# Patient Record
Sex: Male | Born: 1985 | Race: White | Hispanic: No | Marital: Single | Smoking: Never smoker
Health system: Southern US, Academic
[De-identification: ages and names within clinical notes are randomized; demographics above are authoritative.]

## PROBLEM LIST (undated history)

## (undated) DIAGNOSIS — K5900 Constipation, unspecified: Secondary | ICD-10-CM

## (undated) DIAGNOSIS — M109 Gout, unspecified: Secondary | ICD-10-CM

## (undated) DIAGNOSIS — D649 Anemia, unspecified: Secondary | ICD-10-CM

## (undated) DIAGNOSIS — I10 Essential (primary) hypertension: Secondary | ICD-10-CM

## (undated) DIAGNOSIS — F319 Bipolar disorder, unspecified: Secondary | ICD-10-CM

## (undated) DIAGNOSIS — R197 Diarrhea, unspecified: Secondary | ICD-10-CM

## (undated) DIAGNOSIS — F419 Anxiety disorder, unspecified: Secondary | ICD-10-CM

## (undated) DIAGNOSIS — K589 Irritable bowel syndrome without diarrhea: Secondary | ICD-10-CM

## (undated) DIAGNOSIS — R000 Tachycardia, unspecified: Secondary | ICD-10-CM

## (undated) DIAGNOSIS — K219 Gastro-esophageal reflux disease without esophagitis: Secondary | ICD-10-CM

## (undated) DIAGNOSIS — B1920 Unspecified viral hepatitis C without hepatic coma: Secondary | ICD-10-CM

## (undated) DIAGNOSIS — R002 Palpitations: Secondary | ICD-10-CM

## (undated) DIAGNOSIS — K760 Fatty (change of) liver, not elsewhere classified: Secondary | ICD-10-CM

## (undated) DIAGNOSIS — R7303 Prediabetes: Secondary | ICD-10-CM

## (undated) DIAGNOSIS — G4730 Sleep apnea, unspecified: Secondary | ICD-10-CM

## (undated) DIAGNOSIS — E785 Hyperlipidemia, unspecified: Secondary | ICD-10-CM

## (undated) DIAGNOSIS — N529 Male erectile dysfunction, unspecified: Secondary | ICD-10-CM

## (undated) DIAGNOSIS — E559 Vitamin D deficiency, unspecified: Secondary | ICD-10-CM

## (undated) DIAGNOSIS — R569 Unspecified convulsions: Secondary | ICD-10-CM

## (undated) DIAGNOSIS — K861 Other chronic pancreatitis: Secondary | ICD-10-CM

## (undated) HISTORY — DX: Gastro-esophageal reflux disease without esophagitis: K21.9

## (undated) HISTORY — DX: Anemia, unspecified: D64.9

## (undated) HISTORY — PX: ESOPHAGOGASTRODUODENOSCOPY: SHX1529

## (undated) HISTORY — DX: Other chronic pancreatitis: K86.1

## (undated) HISTORY — DX: Sleep apnea, unspecified: G47.30

## (undated) HISTORY — DX: Anxiety disorder, unspecified: F41.9

## (undated) HISTORY — DX: Vitamin D deficiency, unspecified: E55.9

## (undated) HISTORY — PX: HX HERNIA REPAIR: SHX51

## (undated) HISTORY — DX: Fatty (change of) liver, not elsewhere classified: K76.0

## (undated) HISTORY — DX: Tachycardia, unspecified: R00.0

## (undated) HISTORY — DX: Male erectile dysfunction, unspecified: N52.9

## (undated) HISTORY — PX: COLONOSCOPY: SHX174

## (undated) HISTORY — DX: Bipolar disorder, unspecified: F31.9

## (undated) HISTORY — DX: Essential (primary) hypertension: I10

## (undated) HISTORY — DX: Unspecified convulsions: R56.9

## (undated) HISTORY — DX: Unspecified viral hepatitis C without hepatic coma: B19.20

## (undated) NOTE — Progress Notes (Signed)
Formatting of this note is different from the original.  New Patient Visit    Patient Name:  Matthew Valenzuela, Male  Date of Birth:  [birth year], 33 y.o.  MEDICAL RECORD NUMBER [id number]  Date:    05/12/2022  Ref Provider:  No primary care provider on file.     History of Present Illness   Matthew Valenzuela is a 28 y.o. male. with PMH of HTN/ ETOH chronic pancreatitis/Sz on Keppra  here for first time for eval of his CKD 2  He denies any nausea, vomiting, diarrhea or weight/appetitie changes .   No urinary symptoms .and no recent hospitalizations or infections.  No NSAIDs intake.    The following portions of the patient's chart were reviewed in this encounter and updated as appropriate:        Current Outpatient Medications   Medication Sig Dispense Refill    amLODIPine (NORVASC) 2.5 MG tablet 2.5 mg  in the morning.      Cariprazine HCl (Vraylar) 3 MG capsule 1 capsule  in the morning.      famotidine (PEPCID) 20 MG tablet 20 mg      gabapentin (NEURONTIN) 300 MG capsule Take 300 mg by mouth in the morning and 300 mg in the evening.      levETIRAcetam XR (KEPPRA XR) 500 MG 24 hr tablet Take 1,000 mg by mouth every morning      omeprazole (PriLOSEC) 40 MG DR capsule 40 mg  in the morning.      propranolol LA (INDERAL LA) 60 MG 24 hr capsule TAKE 1 CAPSULE DAILY AT BEDTIME      QUEtiapine (SEROquel) 300 MG tablet 300 mg  in the morning.      atorvastatin (LIPITOR) 20 MG tablet Take 20 mg by mouth 1 (one) time each day      Ferrous Sulfate (IRON PO) Take by mouth       No current facility-administered medications for this visit.       Not on File    No past medical history on file.    No past surgical history on file.    No family history on file.           Physical Exam  Vitals:    05/12/22 1112   BP: 108/86   BP Location: Right upper arm   Patient Position: Sitting   Weight: 187 lb 12.8 oz (85.2 kg)     Gen: Awake, alert, and oriented. Well nourished  HEENT: Throat clear, Membranes moist. Pupils are equal, round and reactive to light.    Neck: Supple, no jugular venous distention  Heart: Regular Rate and Rhythm, S1, S2. No murmurs, No edema  Lungs: Clear to auscultation bilaterally with good respiratory effort  Abdomen: Soft, non-tender, non-distended, +BS  Extremities: moves all extremities well, all extremities are atraumatic  Skin: No visible rashes, Dry  Neuro: CN 3-12 are grossly intact, Normal speech  Psych: Alert and oriented to person, place, time, situation with a non-depressed mood    LABS:  Chemistry   Lab Units 02/25/22  0000 12/24/21  0000 10/17/21  0000 09/25/21  0000 05/09/21  0000   CREATININE mg/dL 1.23 1.36* 1.43* 1.36* 1.18   BUN mg/dL  --  '15 15 18 16   '$ POTASSIUM   --  4.0 4.4 4.4 4.4   SODIUM   --  138 140 139 141   CO2 mmol/L  --  '21 22 21 21   '$ CHLORIDE   --  101.0 99.0 99.0 99.0   ALBUMIN g/dL  --  4.3 4.6 4.9 4.8   EGFR  79  --   --   --   --    WBC AUTO 10*3/ML  --  4.7  --  8.5 6.4   HEMATOCRIT   --  46.5  --  48.9 50.4   HEMOGLOBIN   --  15.7  --  16.7 17.5   PLATELETS AUTO 10*3/UL  --  190  --  260 249     Bone Mineral   Lab Units 12/24/21  0000 10/17/21  0000 09/25/21  0000 05/09/21  0000   CALCIUM mg/dL 9.8 9.6 10.4 10.1   VIT D 25 HYDROXY ng/mL 26.6  --   --  36.0     Labs   Lab Units 05/09/21  0000   CHOLESTEROL TOTAL  214   HDL mg/dL 59   LDL CALC  127   TRIGLYCERIDES  160   CHOL/HDL RATIO  2.2         No results found for: "COLORU", "CLARITYU", "SPECGRAVUR", "PHUR", "GLUCOSEU", "KETONESU", "RBCU", "UROBILINOGEN", "LEUKOCYTESU", "NITRITEU"    Iron Studies   Lab Units 12/24/21  0000 09/25/21  0000 05/09/21  0000   IRON UG/DL 123 104 158   FERRITIN NG/ML 36.0 42.0 91.0   TIBC ug/dL 367 390 459   IRON SATURATION % 34 27 34     Available Imaging results reviewed.    Assessment & Plan   1. Chronic kidney disease, stage 2 (mild)    2. Hypertension      CKD stage 2  Cr is at 1.2-1.4 w eGFR at >70 ml/min in the past year  This is likely 2/2 HTN + increased muscle mass and possibly chronic dehydration fromhis chronic  diarrhea from chronic pancreatitis + drinking protein shake daily  Pt appears to be stable from volume and electrolytes stand point  Pt was educated about his stage 3 and methods to slow it down  Pt was advised to stay away from all NSAIDs ,stay hydrated and avoid IV contrast  BP goal should be below 140/90 mmHg    HTN   Controlled  On Norvasc    Sz disorder  On Keppra/gabapentin    ETOH pancreatitis  Chronic   On Inderal    No orders of the defined types were placed in this encounter.    Return in about 1 year (around 05/13/2023).      Fidela Salisbury, MD  Electronically signed by Fidela Salisbury, MD at 05/12/2022 11:25 AM EDT

---
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
## 2016-06-13 IMAGING — US ABD COMPLETE
1 series · 13 of 25 positions shown · non-contrast
Comparison: CT abdomen and pelvis dated 06/28/2008.

Exam:   
Complete abdominal sonogram, [facility]
INDICATION: Chronic pancreatitis and hepatitis C.

[Series 1: abd complete · oblique · 13 of 92 slices shown]
[im 1/92]
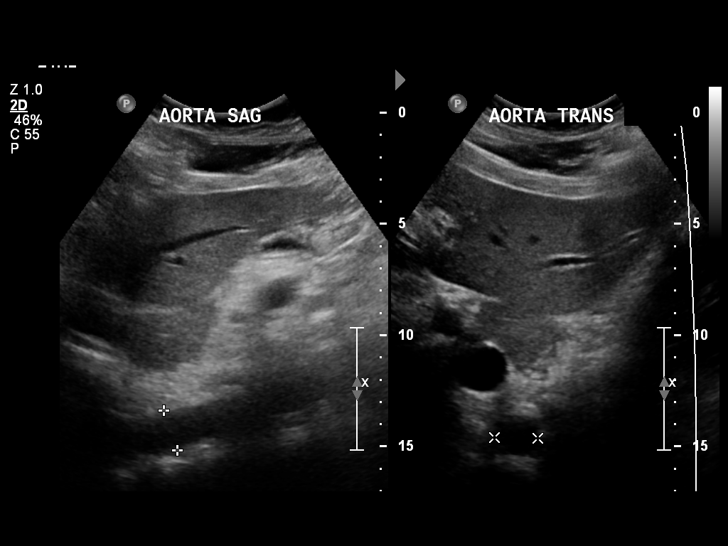
[im 8/92]
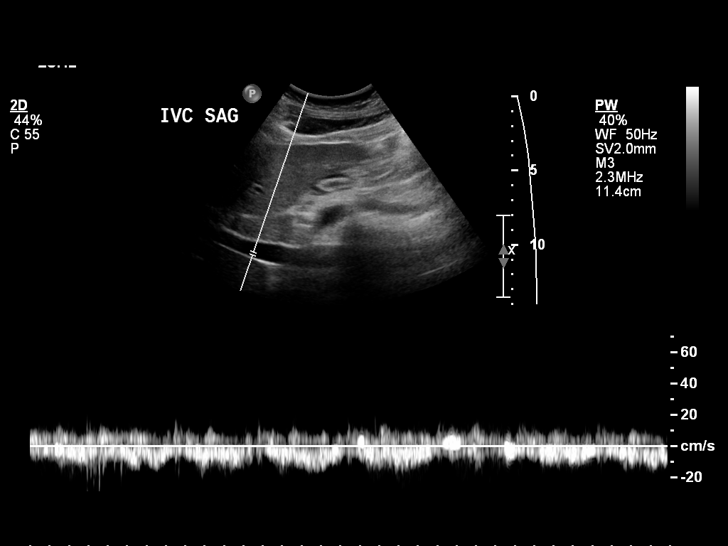
[im 16/92]
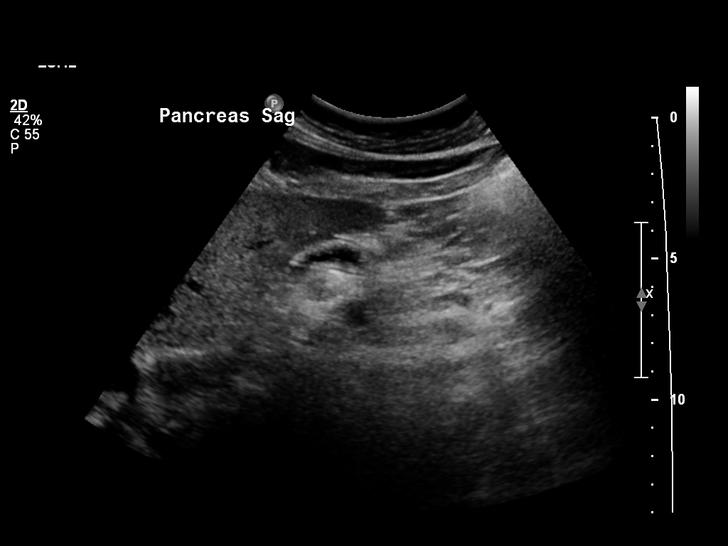
[im 23/92]
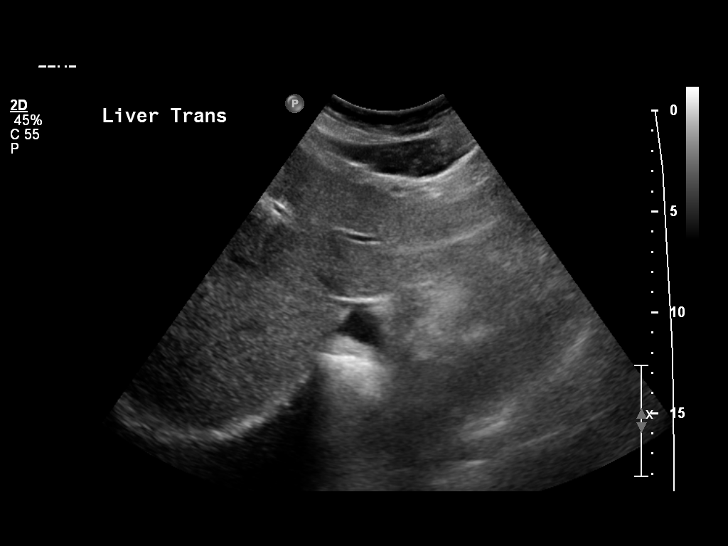
[im 31/92]
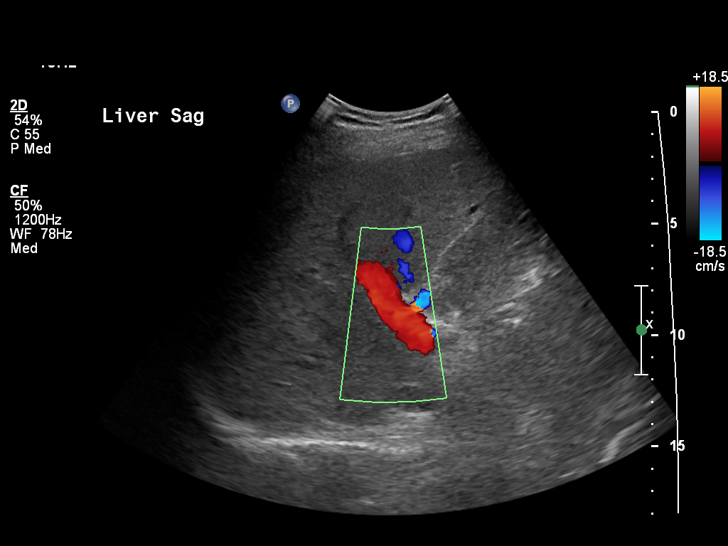
[im 38/92]
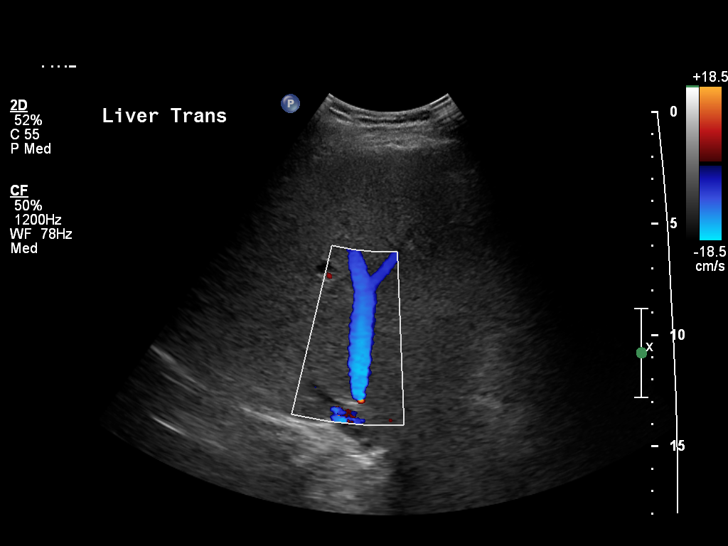
[im 46/92]
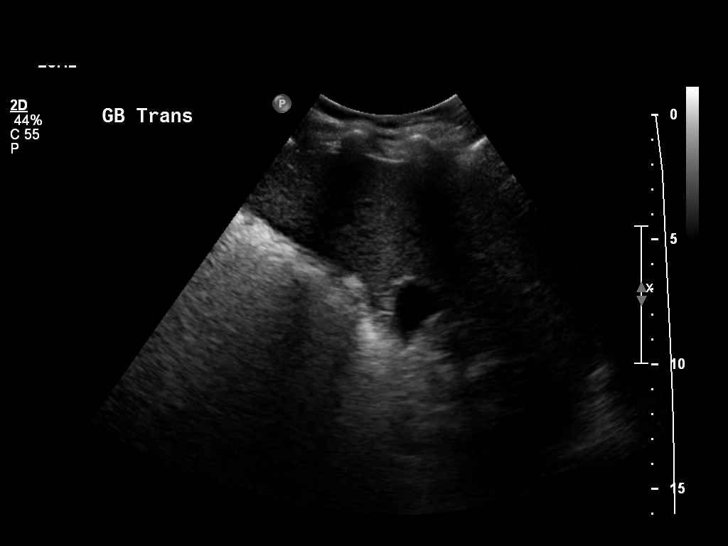
[im 54/92]
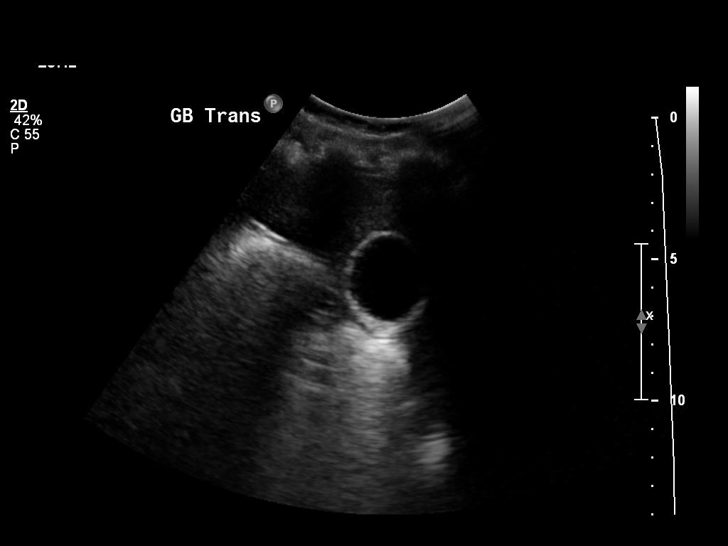
[im 61/92]
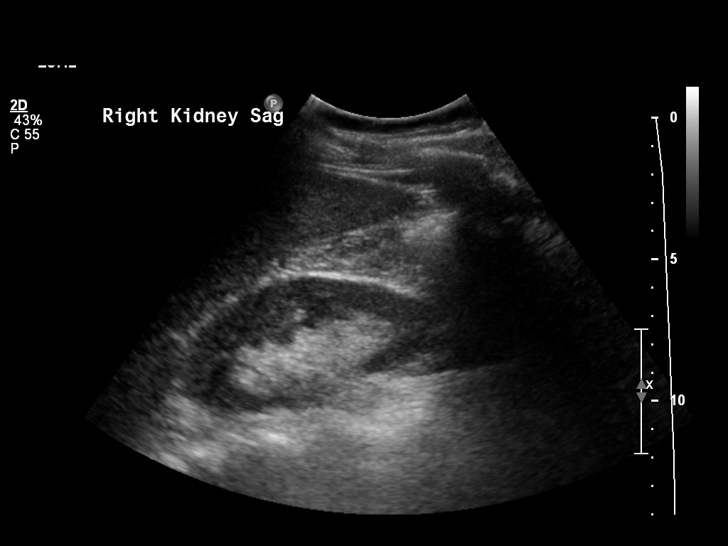
[im 69/92]
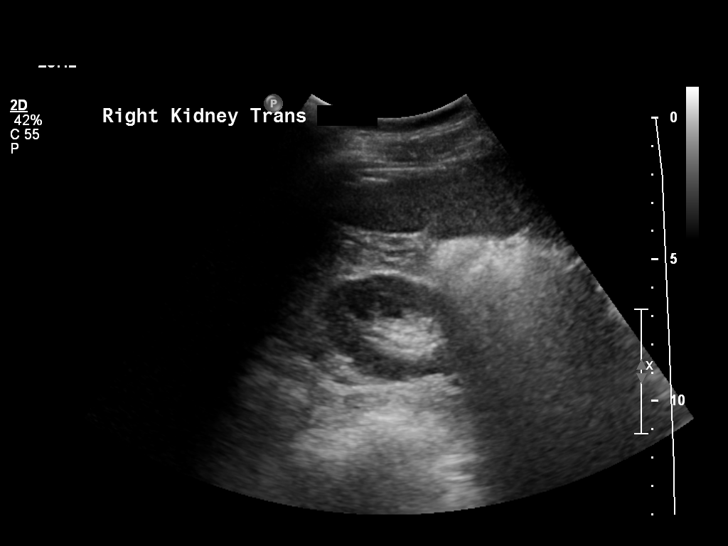
[im 76/92]
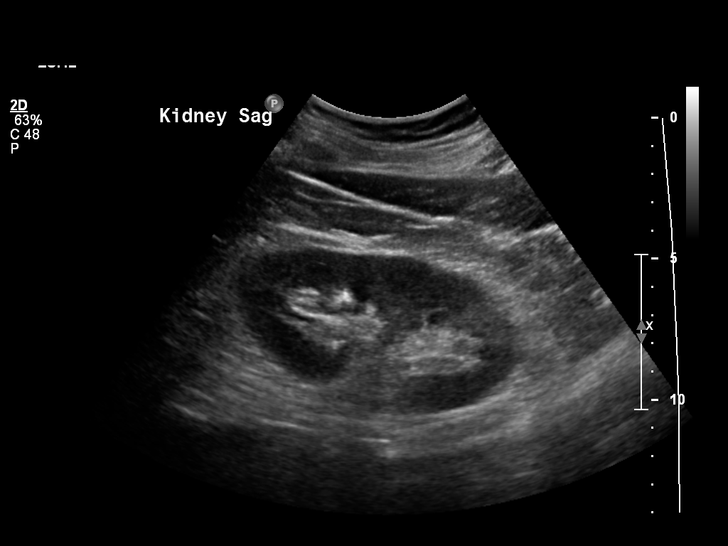
[im 84/92]
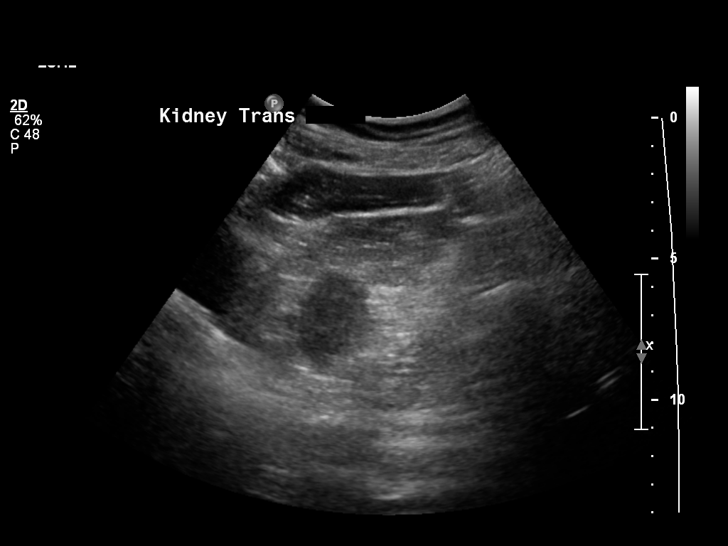
[im 92/92]
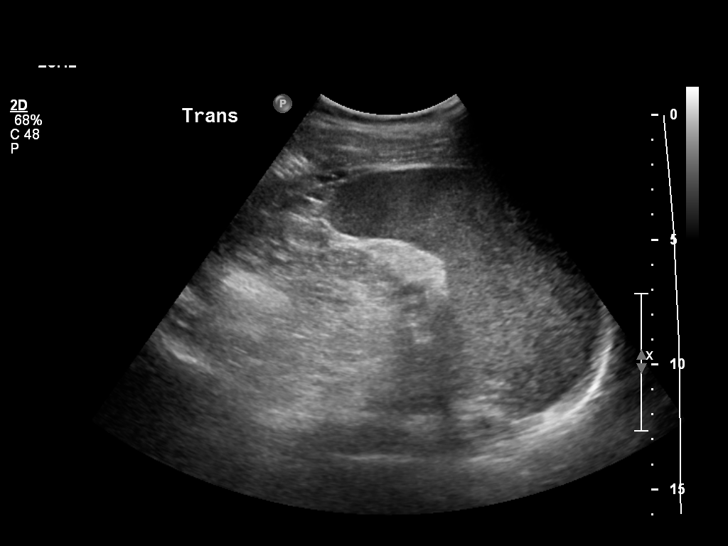

[13 of 25 positions shown; findings below may reference images not displayed]

FINDINGS: Liver is fatty and borderline enlarged measuring 18 cm in maximum sagittal dimension. Fatty infiltration limits evaluation for a focal hepatic mass. There is no intra or extra hepatic biliary ductal dilatation. Common bile duct measures 3 mm. No sludge or shadowing calculi are seen within the gallbladder. There is no gallbladder wall thickening or pericholecystic fluid. Pancreas is incompletely visualized due to artifact from overlying bowel gas. Spleen measures 10.5 cm and is normal. 
Kidneys are normal in echogenicity and measure 10 cm bilaterally. There is no hydronephrosis, mass or shadowing calculus on either side. 
Visualized abdominal aorta is without aneurysmal dilatation. IVC is normal. Portal vein measures 1 cm in diameter and demonstrates hepatopetal flow. Hepatic veins are also patent. There is no ascites.
IMPRESSION: 1.
Fatty and borderline enlarged liver. 
2.
No evidence of cholelithiasis or acute cholecystitis. 
3.
Pancreas incompletely visualized due to artifact from overlying bowel gas.

## 2017-11-23 IMAGING — US US ABDOMEN COMPLETE
1 series · 14 of 25 positions shown · non-contrast
Comparison: None.

EXAM:  US ABDOMEN COMPLETE
INDICATION: Chronic pancreatitis.

[Series 4: us abdomen complete · 0.35mm/px · 14 of 43 slices shown]
[im 1/43]
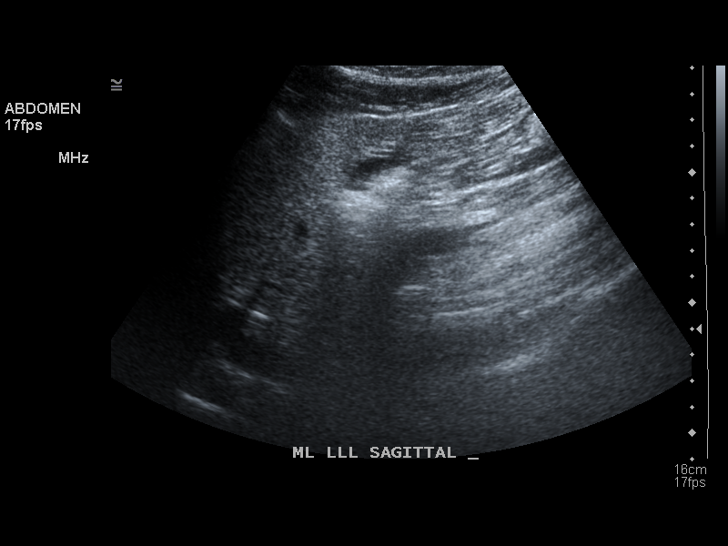
[im 4/43]
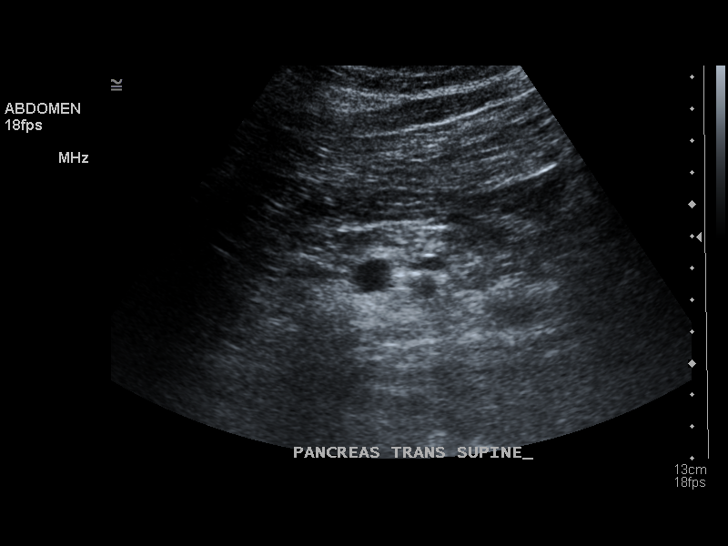
[im 8/43]
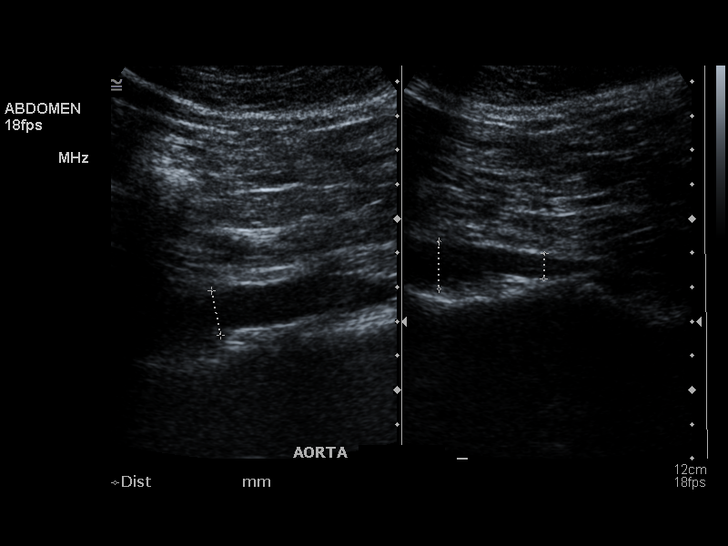
[im 11/43]
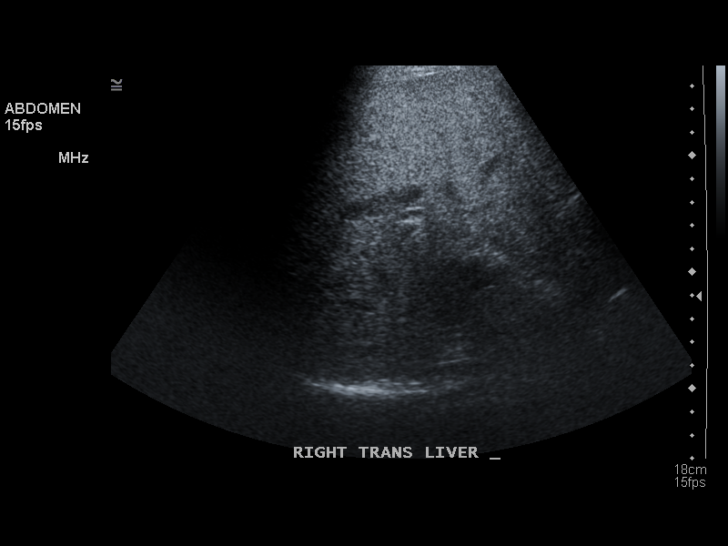
[im 15/43]
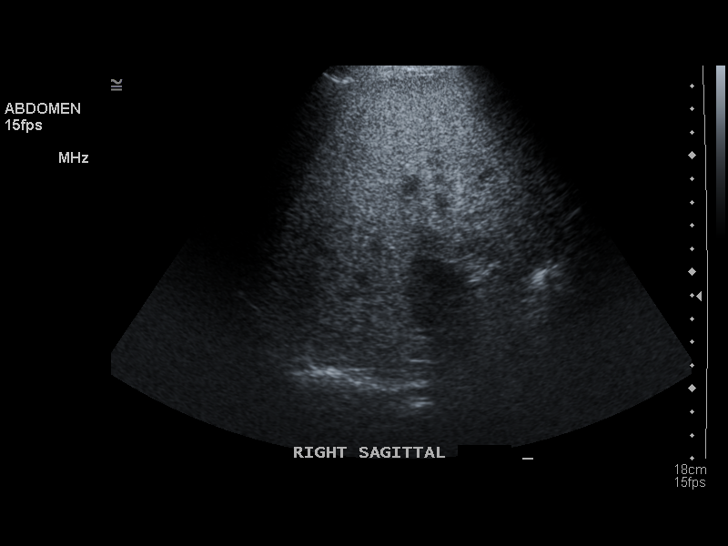
[im 16/43]
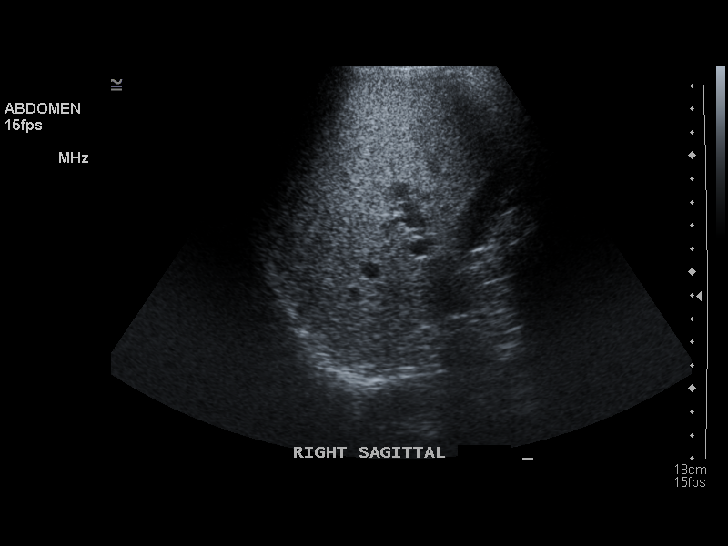
[im 20/43]
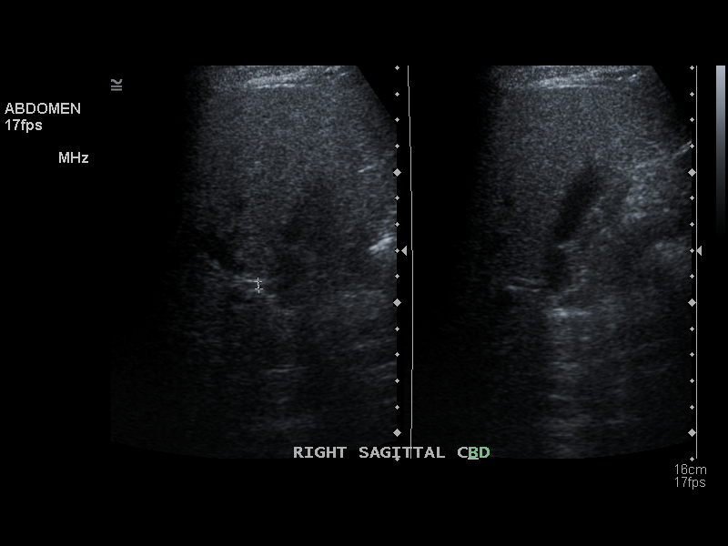
[im 23/43]
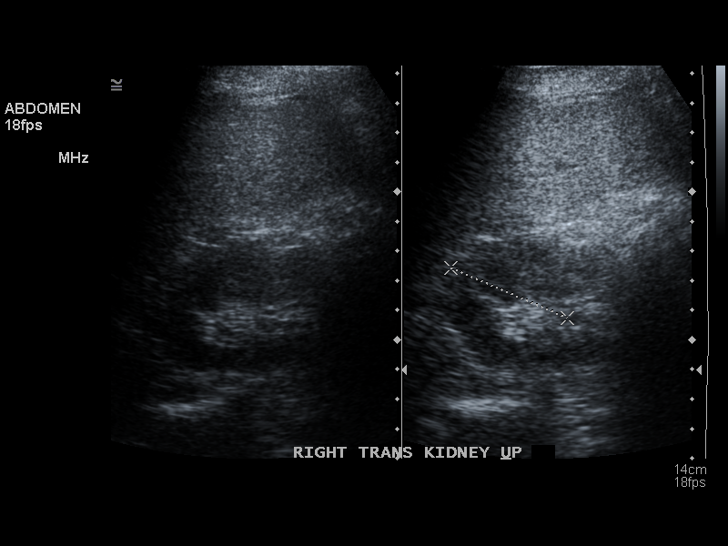
[im 27/43]
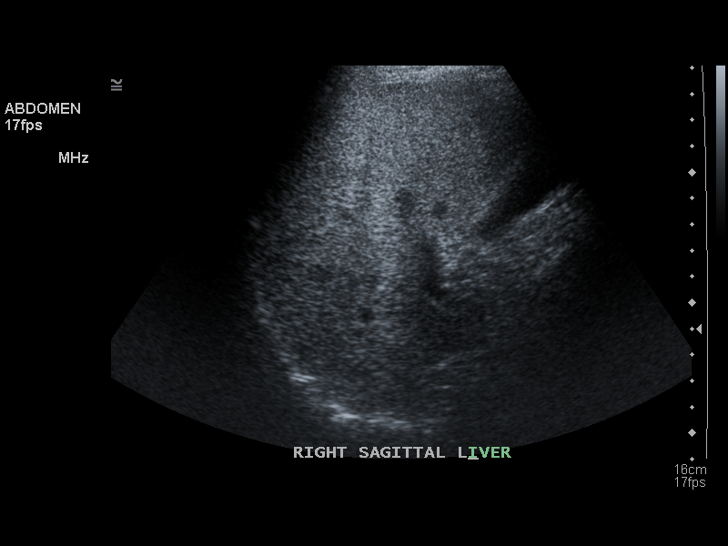
[im 29/43]
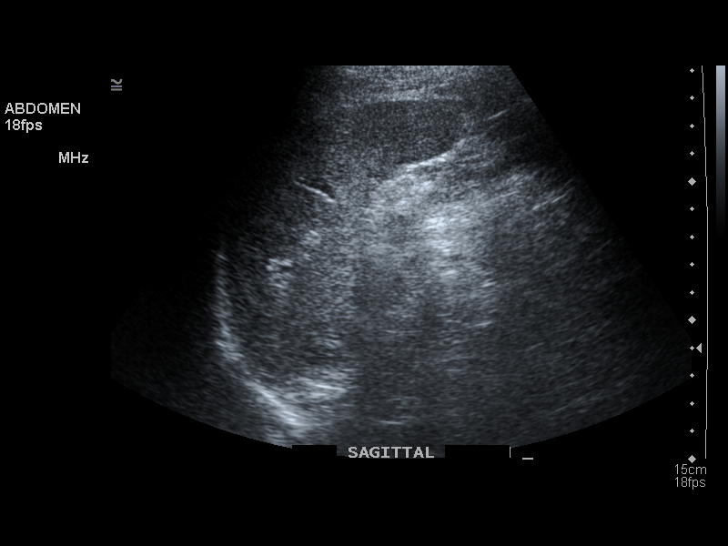
[im 32/43]
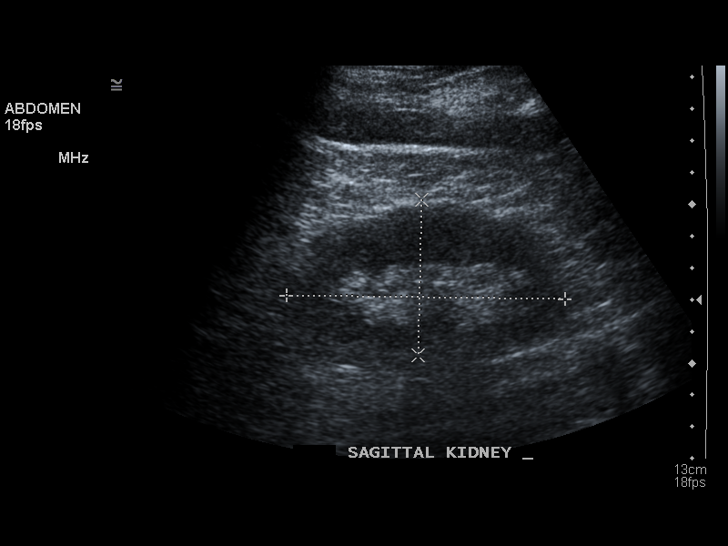
[im 36/43]
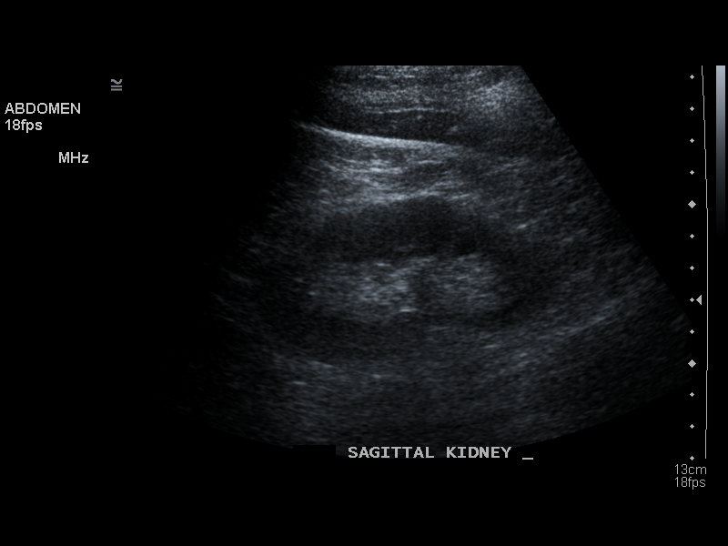
[im 39/43]
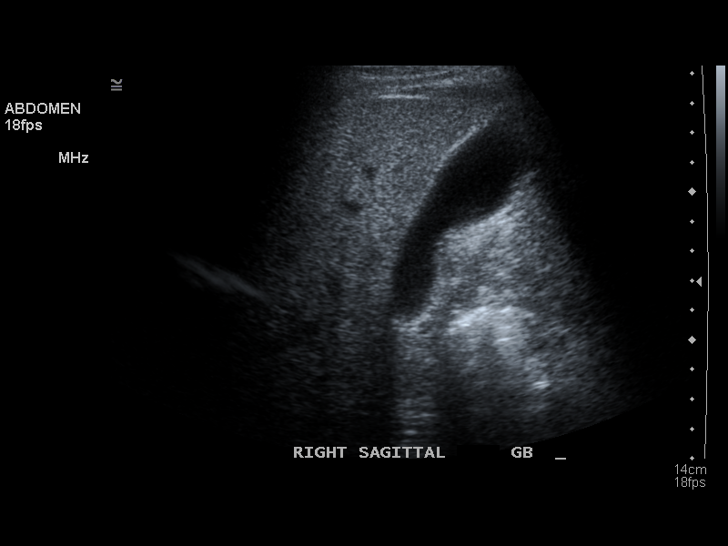
[im 43/43]
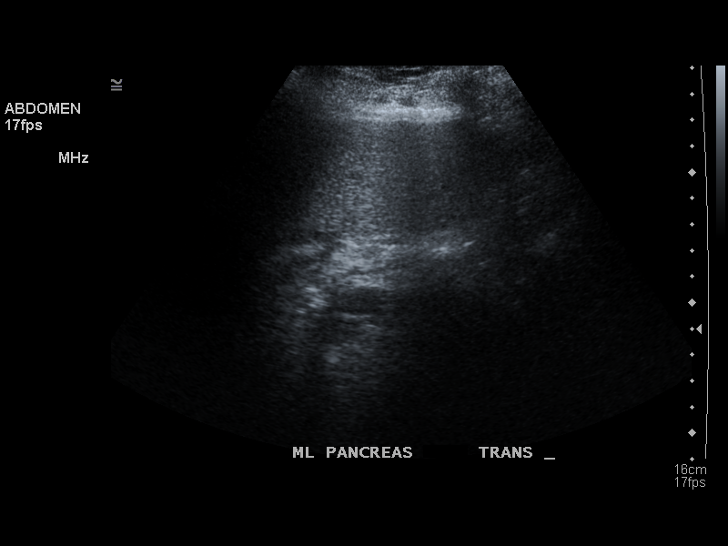

[14 of 25 positions shown; findings below may reference images not displayed]

FINDINGS: The liver measures 15.3 cm.  The portal vein is 15 mm in diameter and shows hepatopetal flow.  No echogenic filling defects are seen in the gallbladder.  The gallbladder wall measures 2 mm and the common duct is 3 mm in caliber.  Intestinal gas obscures the pancreas.  The spleen measures 12.7 cm and is enlarged.  The visualized portions of the aorta and inferior vena cava are normal in caliber.  The right kidney measures 8.8 cm while the left kidney measures 8.8 cm.  No hydronephrosis is observed.
IMPRESSION: Pancreas not visualized due to intestinal gas.

Normal gallbladder. 

No evidence for an abnormality in the hepatic parenchyma. 

No evidence for hydronephrosis in the kidneys.

## 2020-09-04 ENCOUNTER — Ambulatory Visit (INDEPENDENT_AMBULATORY_CARE_PROVIDER_SITE_OTHER): Payer: Self-pay | Admitting: Anesthesiology

## 2020-09-04 NOTE — Telephone Encounter (Signed)
I called and left message with pt in regards to appt date time. [email or web address] 09-04-20

## 2020-09-17 ENCOUNTER — Ambulatory Visit (INDEPENDENT_AMBULATORY_CARE_PROVIDER_SITE_OTHER): Payer: Self-pay | Admitting: Anesthesiology

## 2020-09-17 NOTE — Telephone Encounter (Signed)
I called and left message for pt in regards to appointment today. I let him know that I needed to push his appointment back to 1:00 due to the fact that Dr.Bhatia had an emergency case this morning and can not be here until 11:00 am or so. [email or web address] 09-17-20

## 2020-10-29 IMAGING — US ABD LIMITED
1 series · 14 of 25 positions shown · non-contrast
Comparison: 11/23/2017.

EXAM:  [facility] ABD U/S LMTD
INDICATION: K70.41.

[Series 1: abd limited · 14 of 60 slices shown]
[im 1/60]
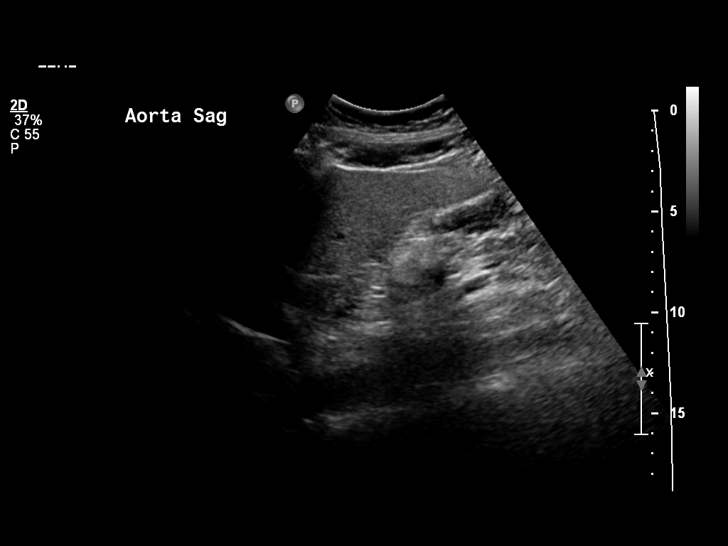
[im 5/60]
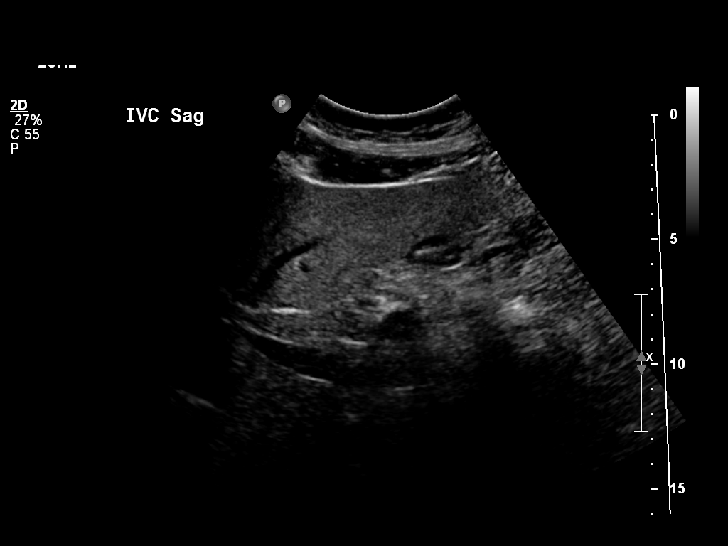
[im 10/60]
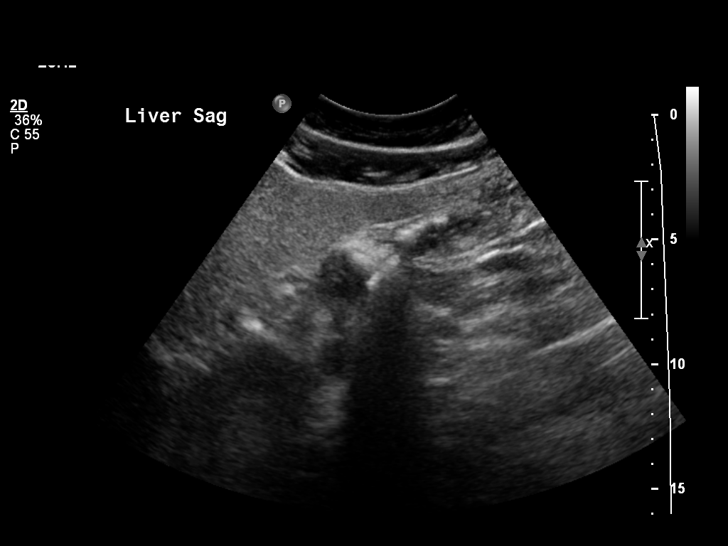
[im 15/60]
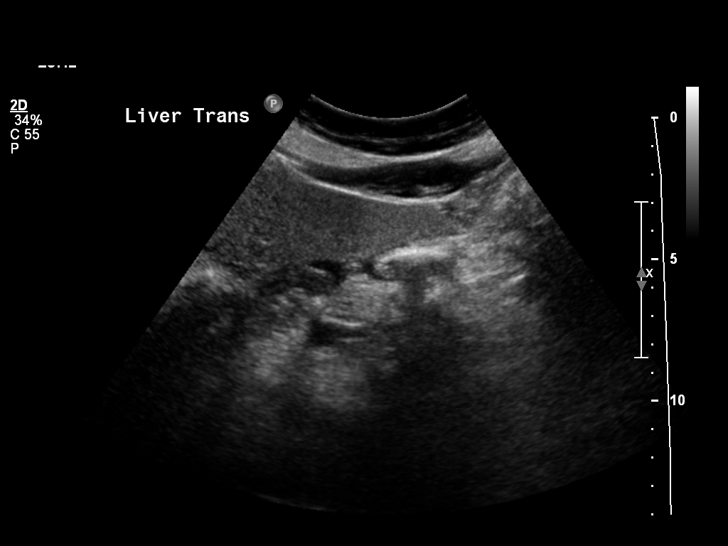
[im 20/60]
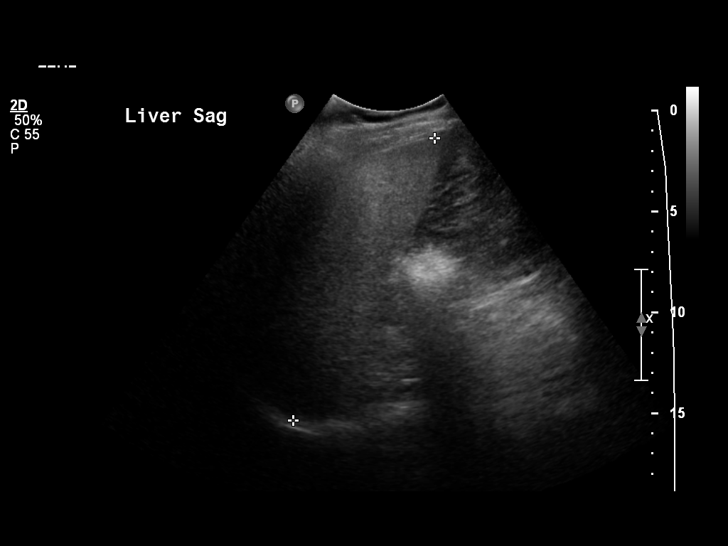
[im 23/60]
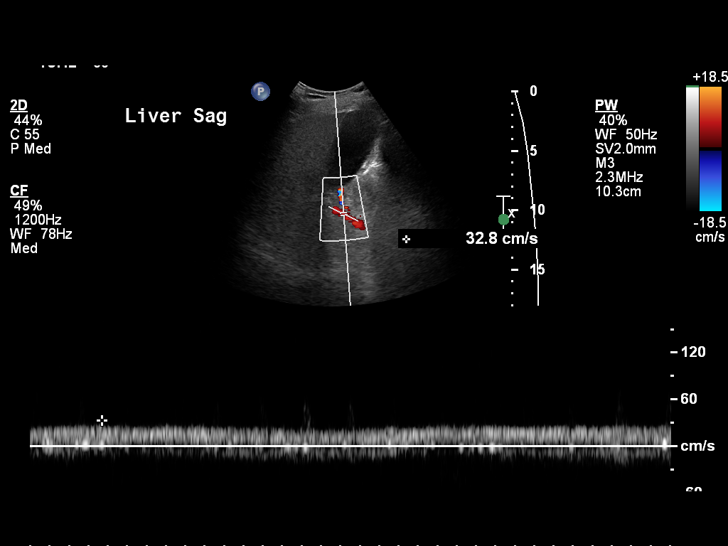
[im 28/60]
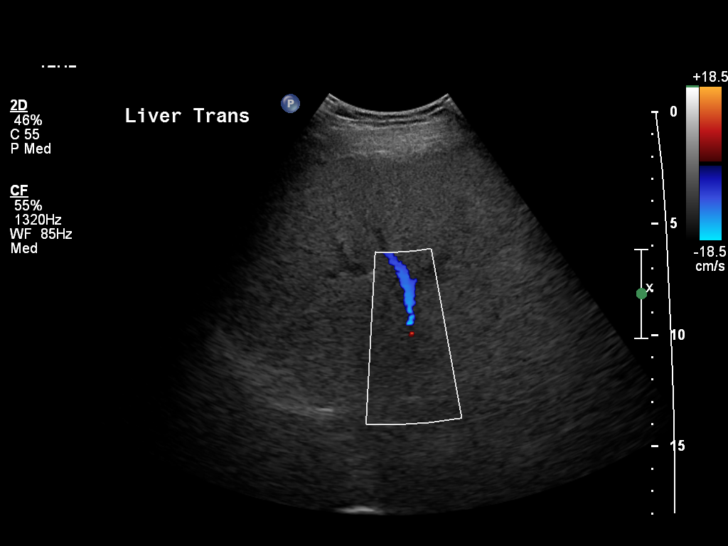
[im 32/60]
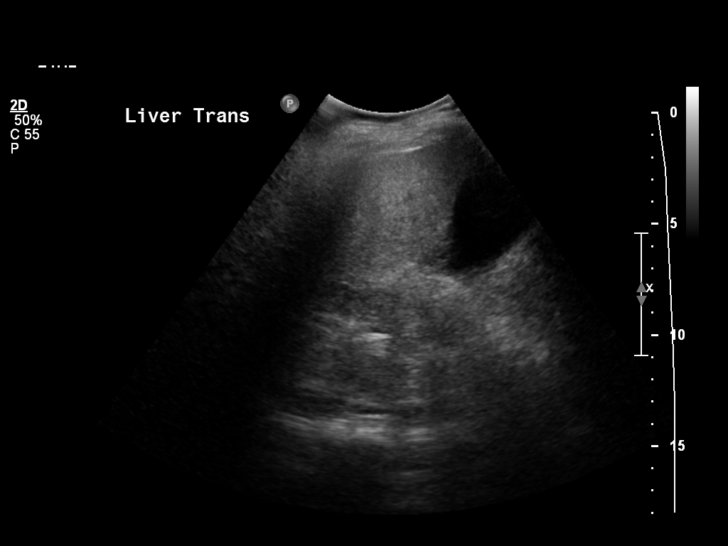
[im 37/60]
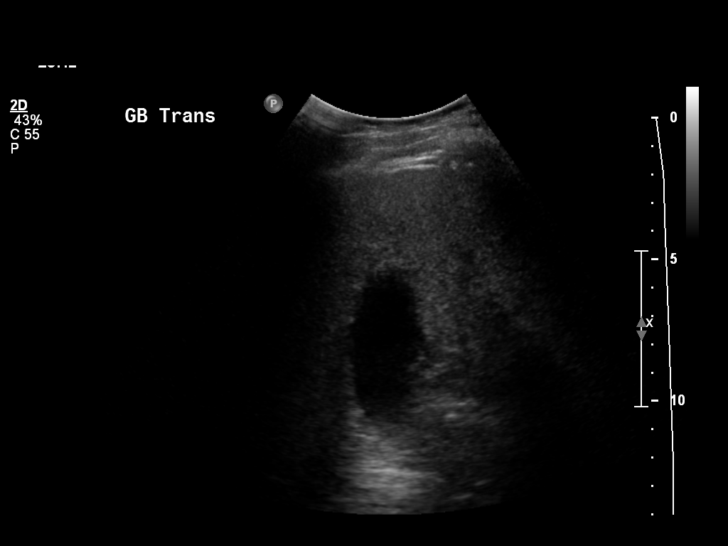
[im 40/60]
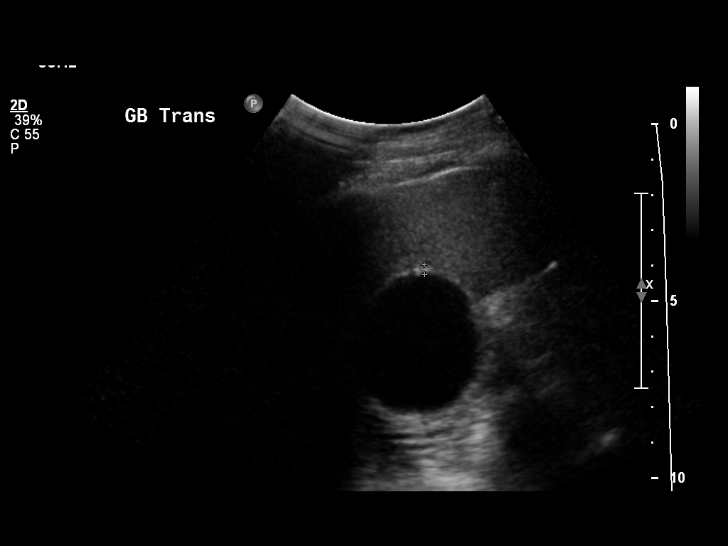
[im 45/60]
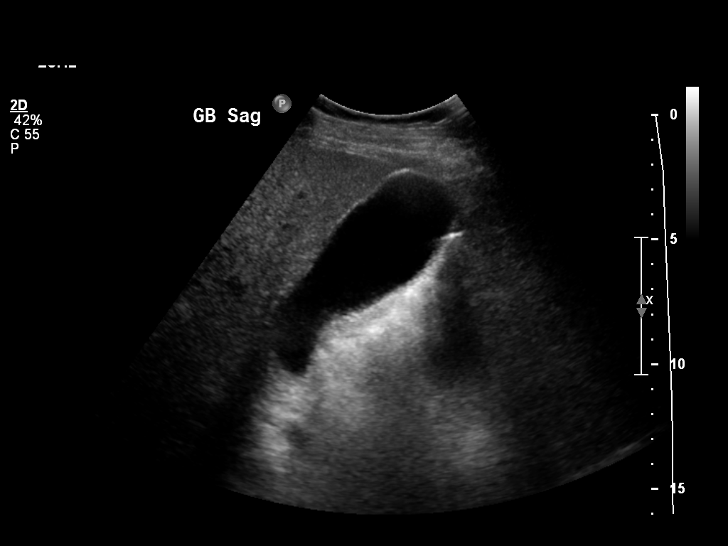
[im 50/60]
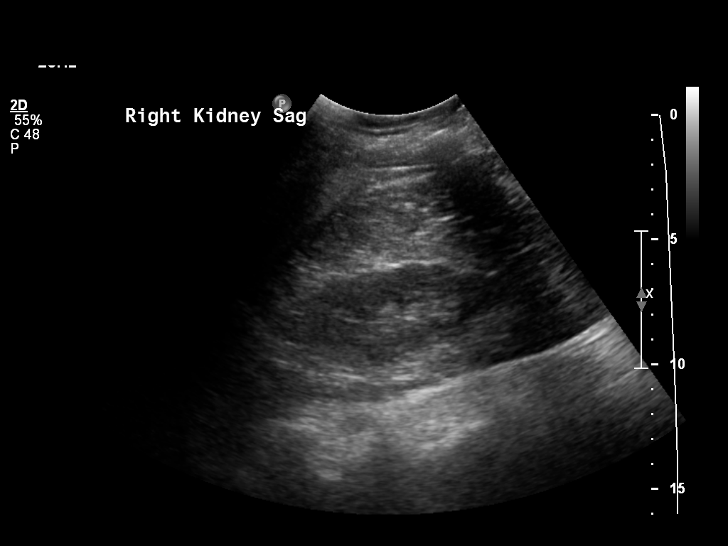
[im 55/60]
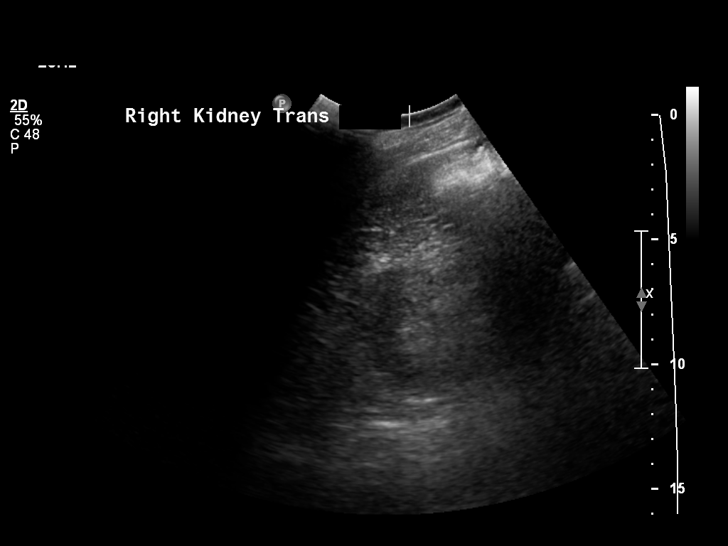
[im 60/60]
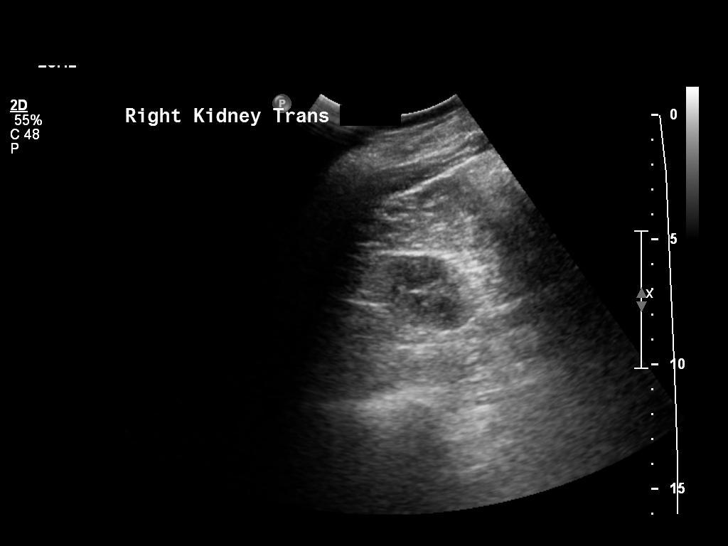

[14 of 25 positions shown; findings below may reference images not displayed]

FINDINGS: Liver is echogenic compatible with fatty infiltration. Fatty infiltration limits evaluation for focal hepatic mass. There is no intra or extrahepatic biliary ductal dilatation. Common bile duct measures 3 mm. No sludge or shadowing gallstones are seen. There is no gallbladder wall thickening or pericholecystic fluid. Pancreas is incompletely visualized due to artifact from overlying bowel gas. Right kidney measures 9.5 cm and is normal.

Visualized abdominal aorta is without aneurysmal dilatation. IVC is normal. Portal vein measures 7 mm in diameter and demonstrates hepatopetal flow. Hepatic veins are also patent. There is no ascites.
IMPRESSION: 1. Fatty liver. 

2. No evidence of cholelithiasis or acute cholecystitis. 

3. Pancreas incompletely visualized due to artifact from overlying bowel gas.

## 2022-01-06 IMAGING — US US ABDOMEN COMPLETE
1 series · 14 of 25 positions shown · non-contrast
Comparison: Sonogram dated 07/06/2019.

﻿EXAM: US ABDOMEN COMPLETE
INDICATION: Abdominal pain, suspected hernia.

[Series 1: us abdomen complete · 14 of 81 slices shown]
[im 1/81]
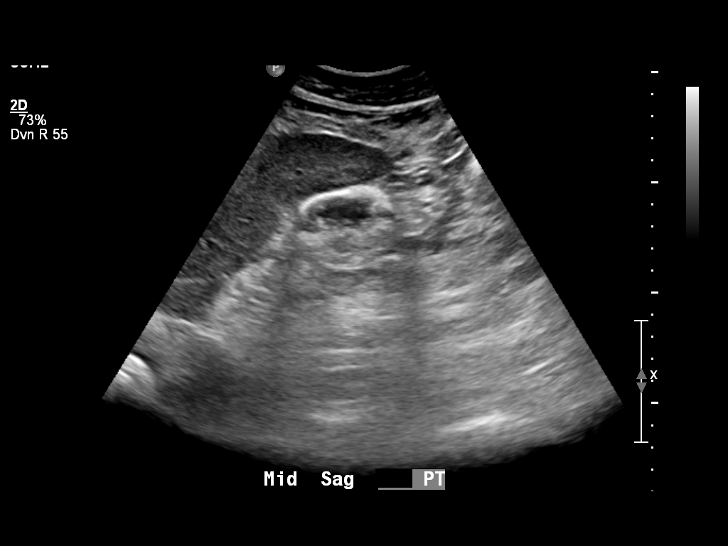
[im 7/81]
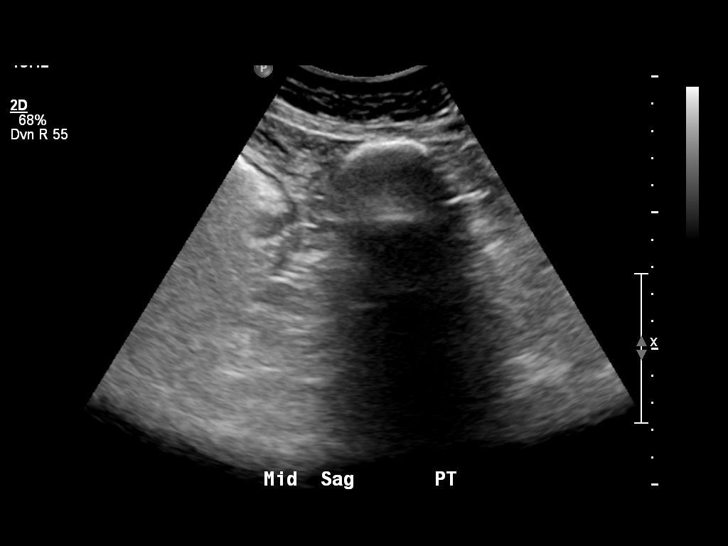
[im 14/81]
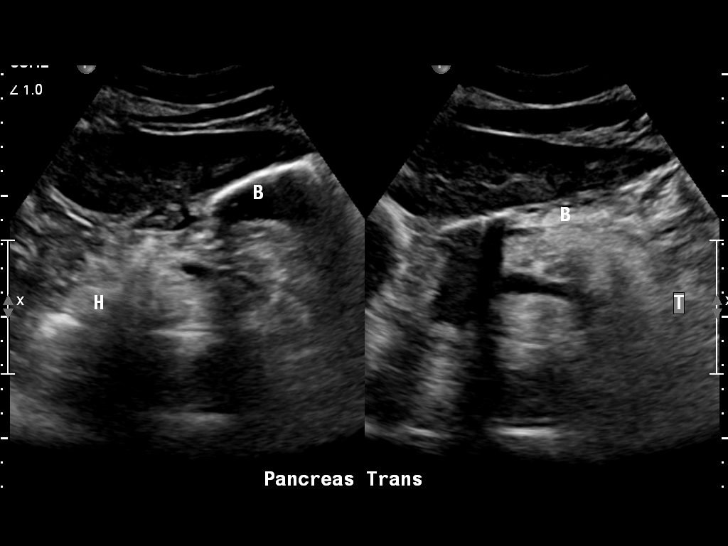
[im 21/81]
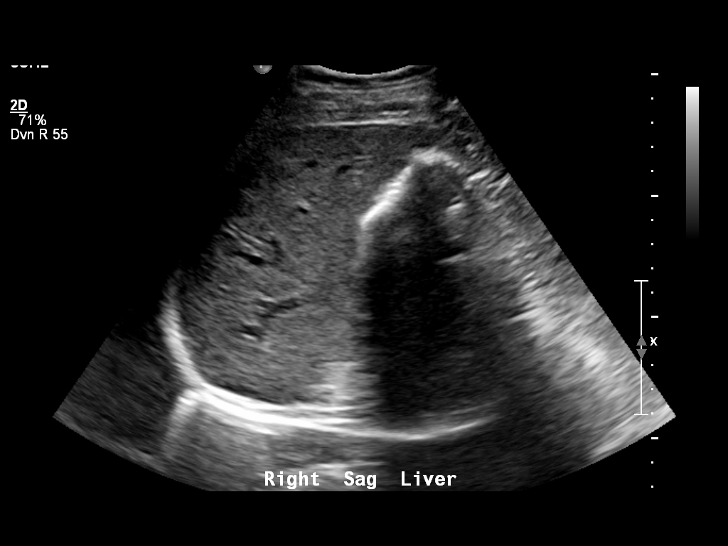
[im 27/81]
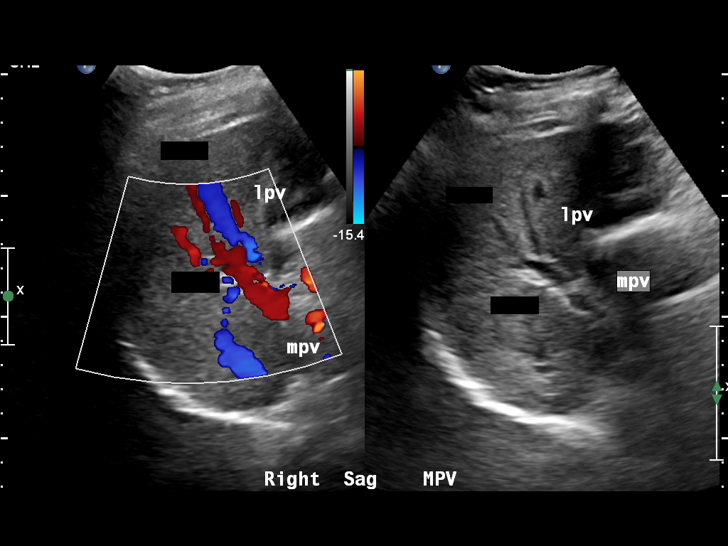
[im 31/81]
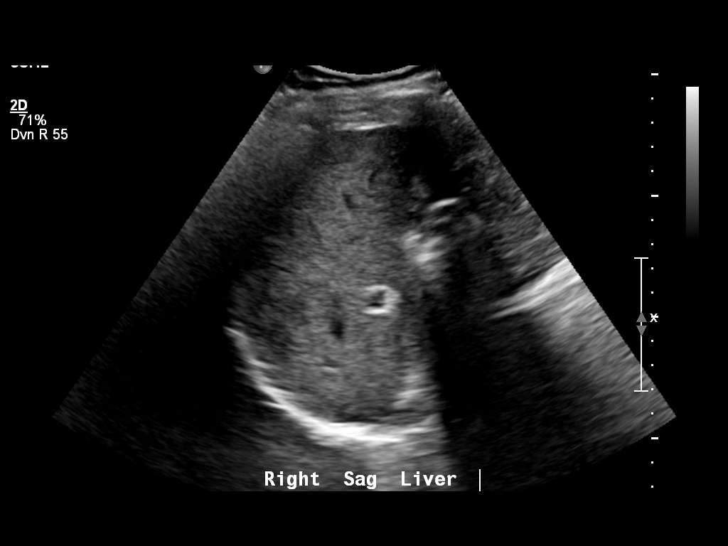
[im 37/81]
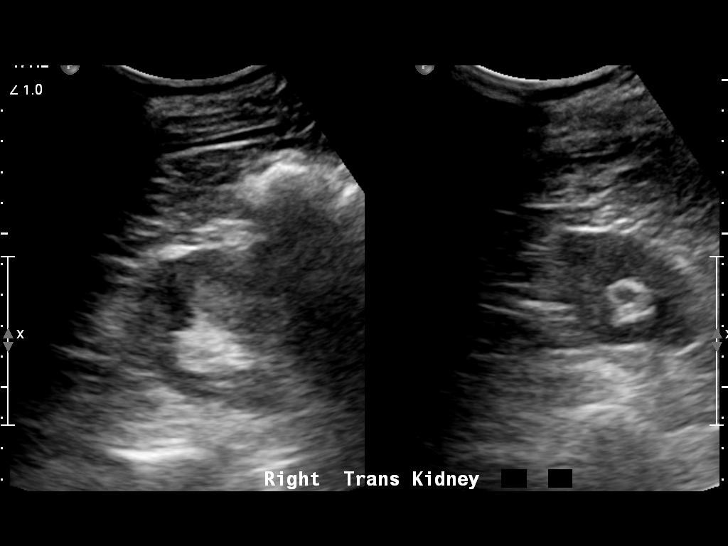
[im 44/81]
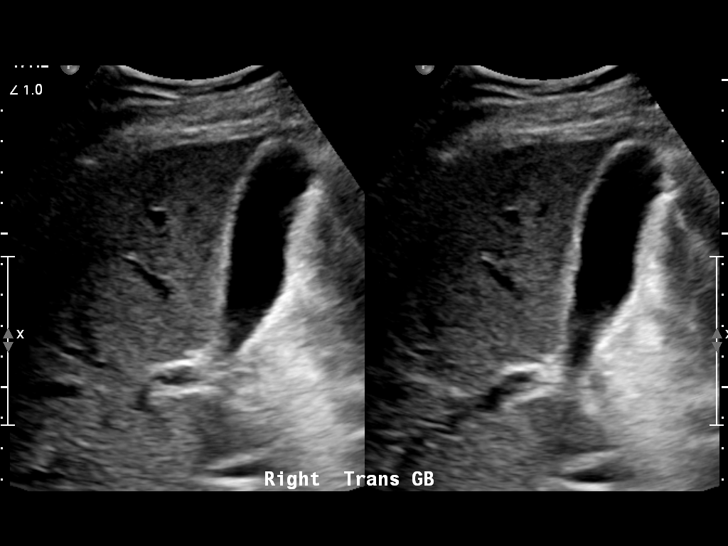
[im 51/81]
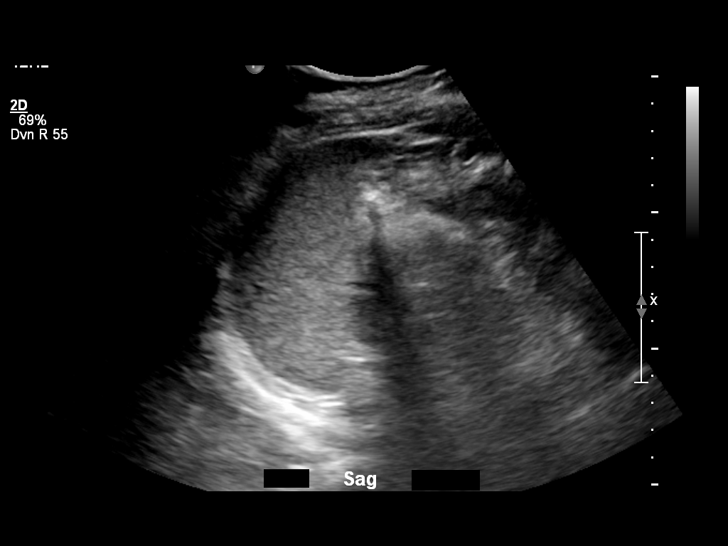
[im 54/81]
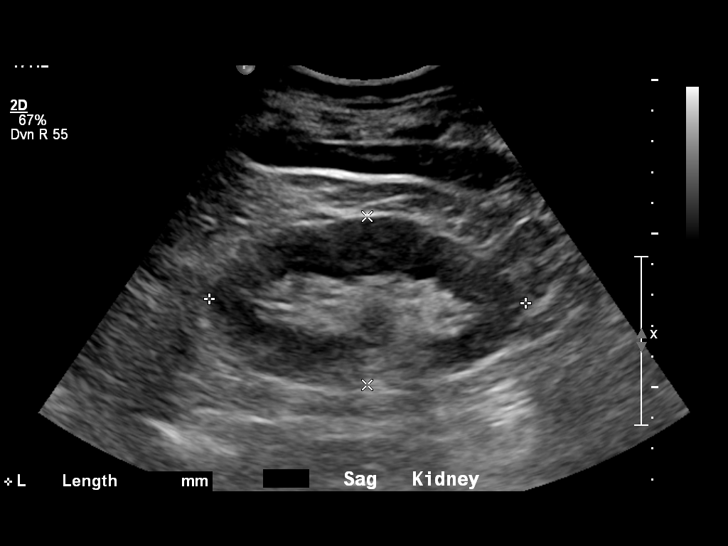
[im 61/81]
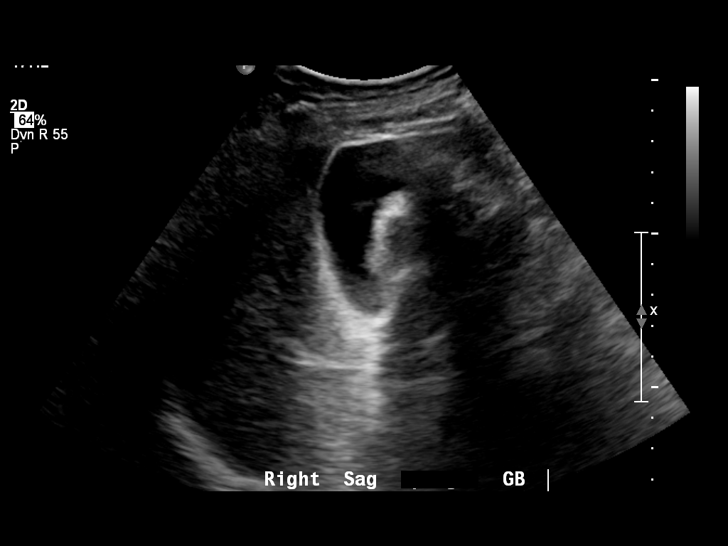
[im 67/81]
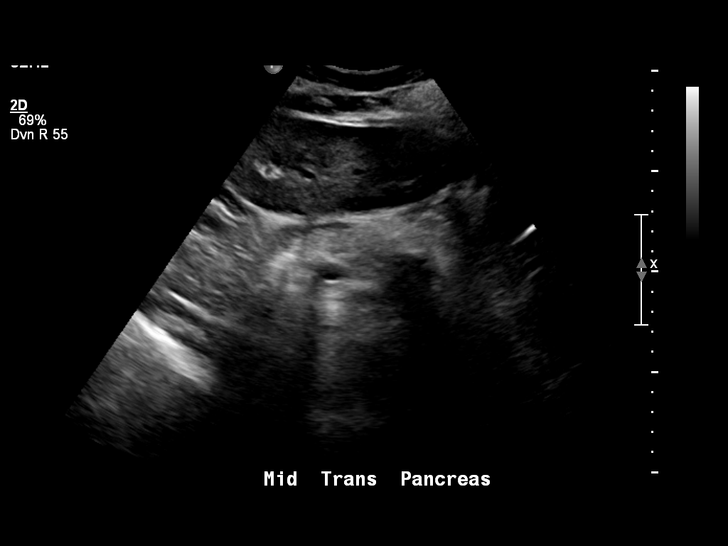
[im 74/81]
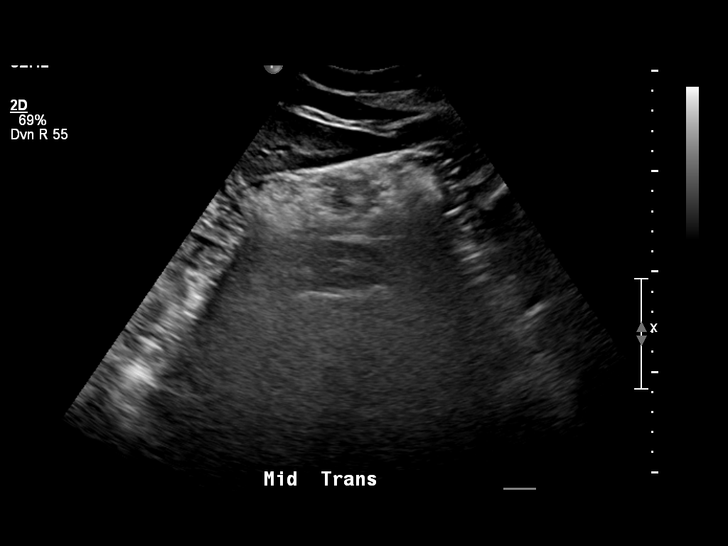
[im 81/81]
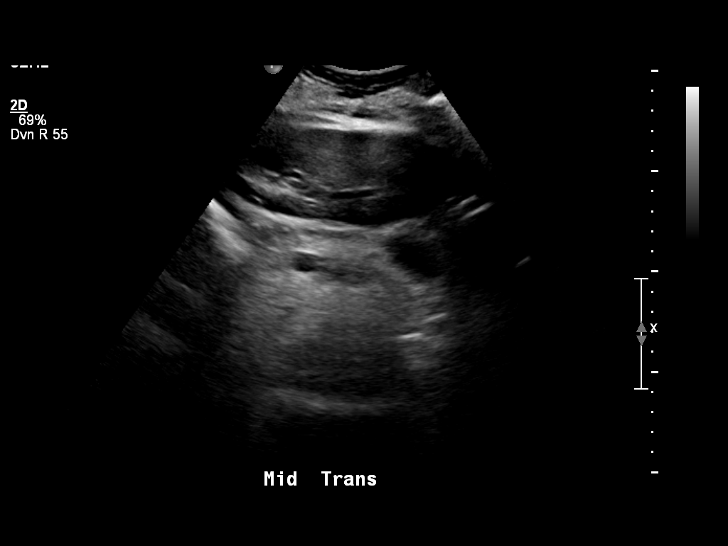

[14 of 25 positions shown; findings below may reference images not displayed]

FINDINGS: Liver is heterogeneous in echogenicity. There is no hepatic mass. There is no intra or extrahepatic biliary ductal dilation. Common bile duct measures 4 mm. No sludge or shadowing gallstones are seen. There is no gallbladder wall thickening or pericholecystic fluid. Pancreas is incompletely visualized due to artifact from overlying bowel gas. Spleen measures 9.5 cm and is unremarkable.  There is no definite ventral hernia.

Kidneys are normal in echogenicity. Right kidney measures 9.5 cm and left kidney measures 10.5 cm. There is no hydronephrosis, mass or cyst on either side.

Visualized abdominal aorta is without aneurysmal dilatation. IVC is normal. There is no ascites.
IMPRESSION: 1. Heterogeneous liver.

2. No evidence of cholelithiasis or acute cholecystitis.

3. No definite ventral hernia.  Follow-up CT scan may be helpful for better assessment if clinically indicated.

## 2022-01-16 ENCOUNTER — Ambulatory Visit (INDEPENDENT_AMBULATORY_CARE_PROVIDER_SITE_OTHER): Payer: Self-pay | Admitting: Student in an Organized Health Care Education/Training Program

## 2022-02-10 IMAGING — CT CT ABDOMEN & PELVIS WITH CONTRAST
2 of 3 series · 17 of 46 positions shown, 19 images · IV contrast (ultravist)
Comparison: Abdominal sonogram dated 01/06/2022.

﻿EXAM:  CT ABDOMEN & PELVIS WITH CONTRAST
INDICATION: Mid abdominal pain.
TECHNIQUE: Axial CT imaging of the abdomen and pelvis was performed following intravenous administration of 75 mL of Ultravist 300. Oral contrast was also administered. Images were reviewed in multiple windows and projections. Exam was performed using 1 or more of the following dose reduction techniques: Automated exposure control, adjustment of the mA and/or kV according to patient size, or the use of iterative reconstruction technique.

[post · axial · 0.78mm/px · z∈[-611,-149]mm · 14 of 178 slices shown, 16 images]
[im 12/178  soft-tissue]
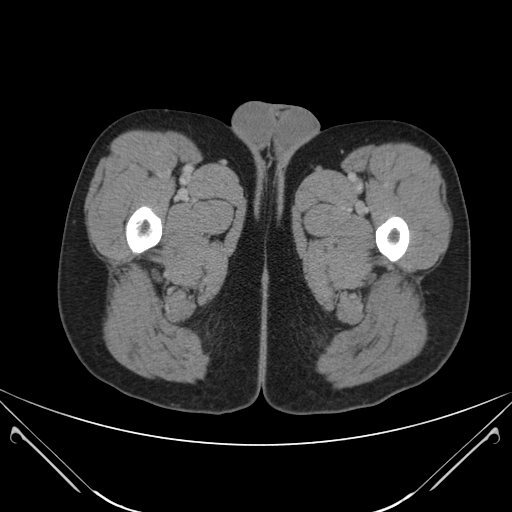
[im 12/178  bone]
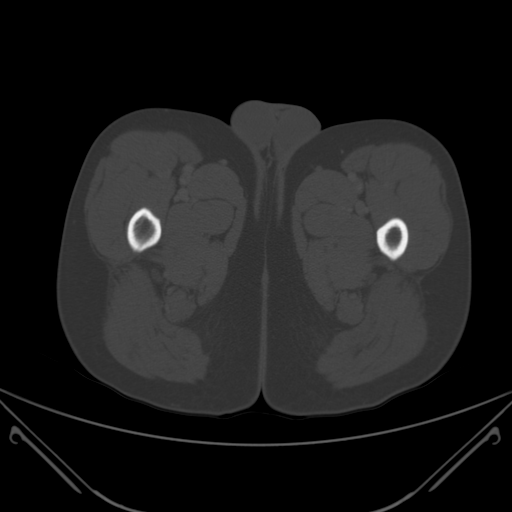
[im 23/178  soft-tissue]
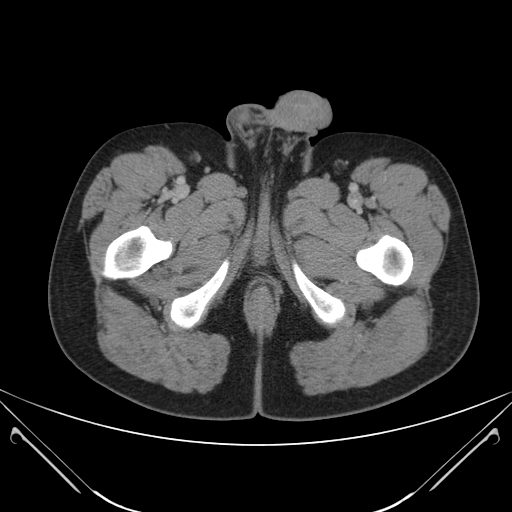
[im 35/178  soft-tissue]
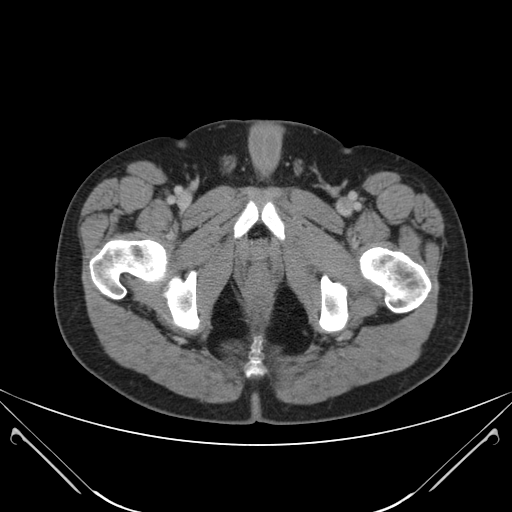
[im 46/178  soft-tissue]
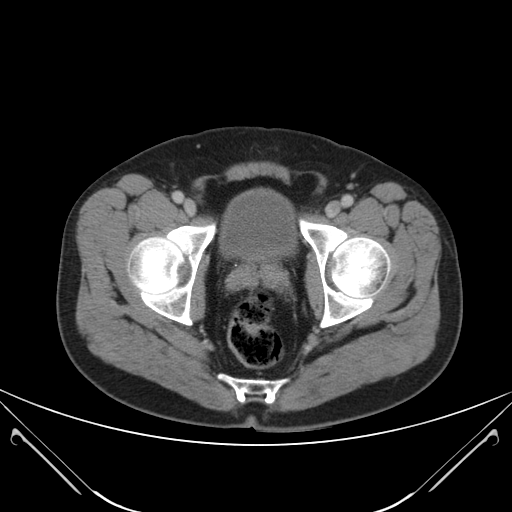
[im 58/178  soft-tissue]
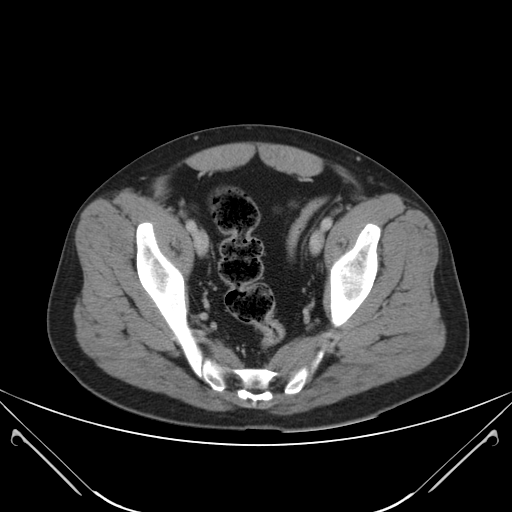
[im 69/178  soft-tissue]
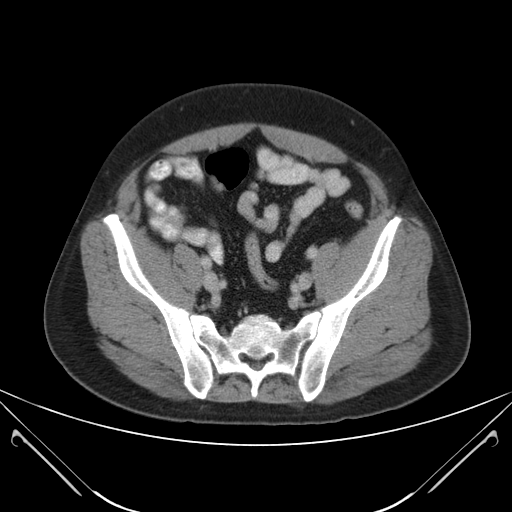
[im 80/178  soft-tissue]
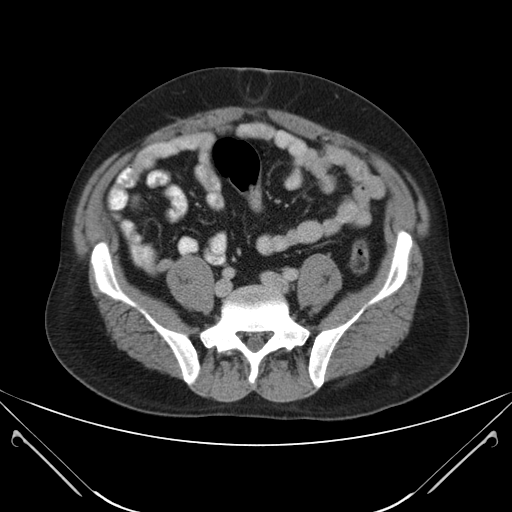
[im 98/178  soft-tissue]
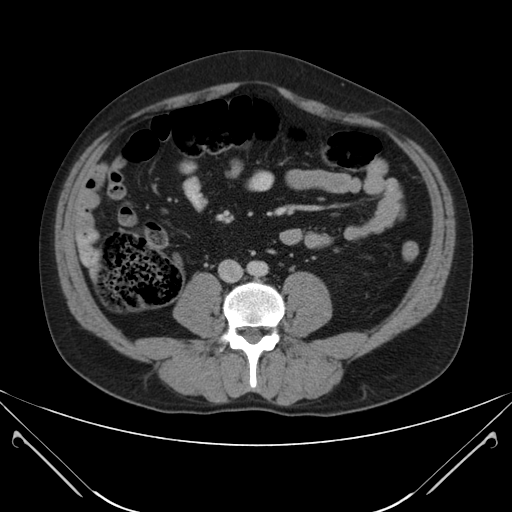
[im 109/178  soft-tissue]
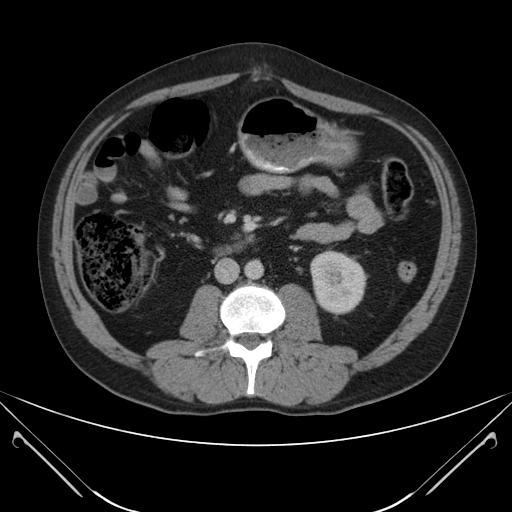
[im 109/178  bone]
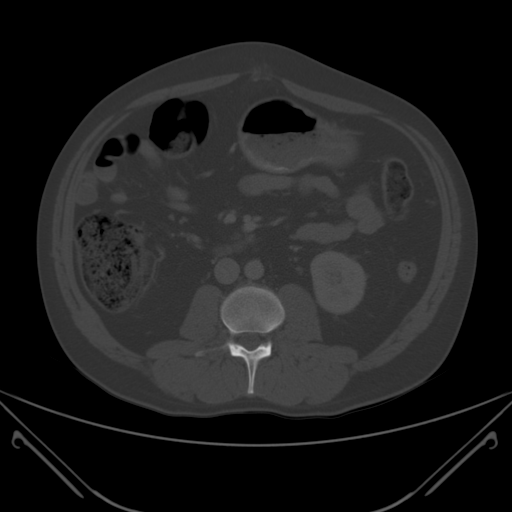
[im 120/178  soft-tissue]
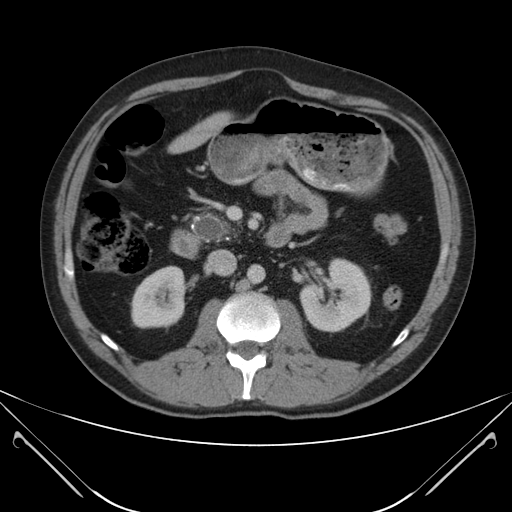
[im 132/178  soft-tissue]
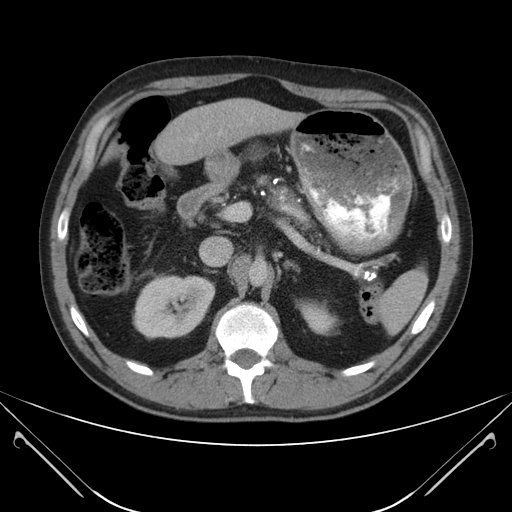
[im 143/178  soft-tissue]
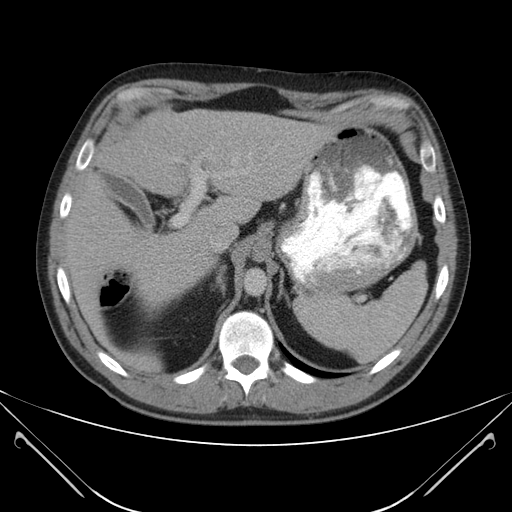
[im 155/178  soft-tissue]
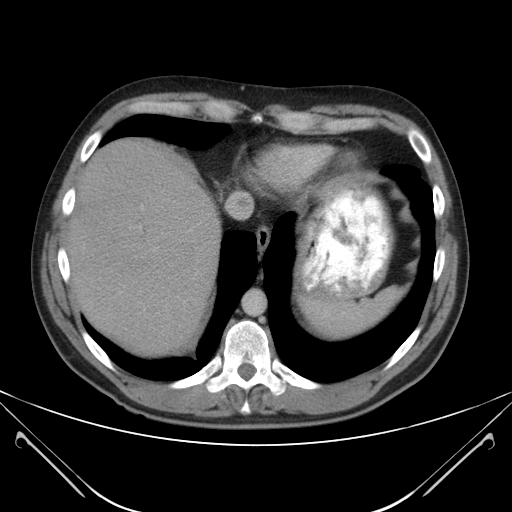
[im 166/178  soft-tissue]
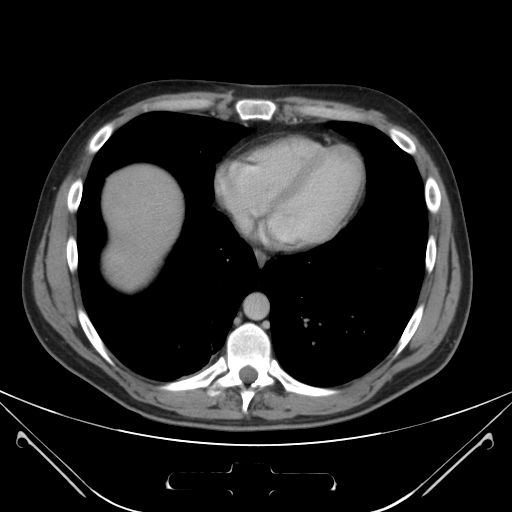

[cor · coronal · 1.04mm/px · 3 of 69 slices shown]
[im 23/69  soft-tissue]
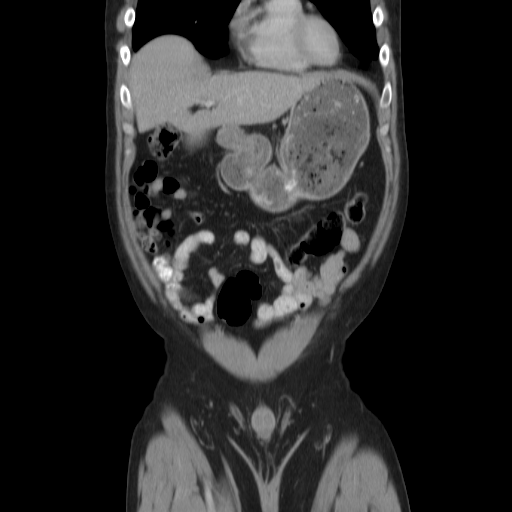
[im 31/69  soft-tissue]
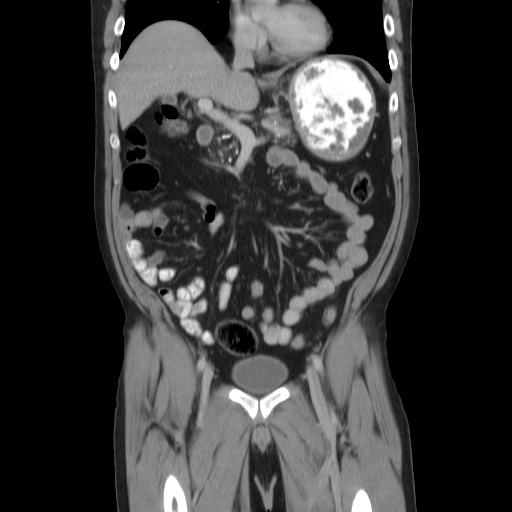
[im 38/69  soft-tissue]
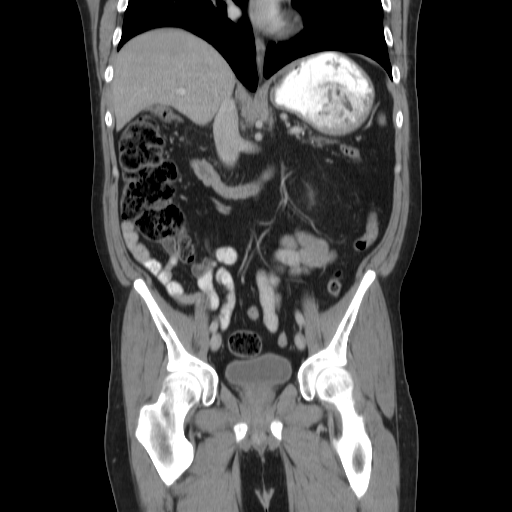

[17 of 46 positions shown; findings below may reference images not displayed]

FINDINGS: There is mild right lower lobe scarring. There is no pleural or pericardial effusion.

Gallbladder is contracted.  Pancreas is markedly atrophic. Numerous pancreatic calcifications are suggestive of chronic pancreatitis.  There is a questionable 21 mm cystic pancreatic head mass.  Liver, spleen, adrenal glands and kidneys are normal.

Bowel loops are normal in course and caliber, there is no obstruction or free air. Appendix is normal.  There is a 7.5 mm supraumbilical ventral hernia containing fat and a 22 mm umbilical hernia containing fat.  There is no ascites or adenopathy.  Visualized osseous structures are unremarkable.
IMPRESSION: 1. Two small fat containing ventral hernias as detailed above.  

2. No acute abnormality within the upper abdomen and pelvis.  

3. Chronic pancreatitis with a questionable 21 mm cystic pancreatic head mass.  Follow-up contrast-enhanced MRI is recommended for better assessment.

## 2022-03-02 IMAGING — MR MRI ABDOMEN WITHOUT AND WITH CONTRAST
14 series · 47 of 48 positions shown · IV contrast (gadavist)
Comparison: CT abdomen and pelvis with contrast dated 02/10/2022 and with and without contrast dated 06/28/2008.  Ultrasound gallbladder dated 01/06/2022.

﻿EXAM:  [id number]   MRI ABDOMEN WITHOUT AND WITH CONTRAST, ATTENTION PANCREAS
INDICATION: 35-year-old underwent CT examination of the abdomen which showed a 21 mm sized cystic lesion in the head of the pancreas in addition to changes of chronic pancreatitis.  Further evaluation suggested with MRI with and without contrast.  No history of trauma, surgery or malignancy.
TECHNIQUE: Axial, coronal and sagittal images were obtained including postcontrast series after administration of 10 mL Gadavist IV and including delayed postcontrast study at 5-minutes post administration of contrast.

[Series 7: cor basg bh · coronal · 5.5mm · 1.25mm/px · 3 of 20 slices shown]
[im 1/20]
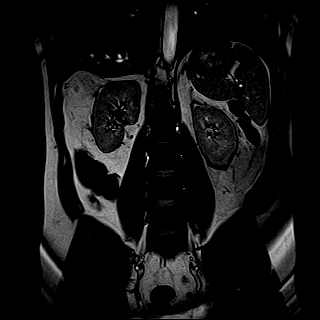
[im 10/20]
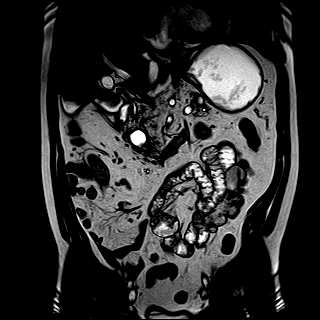
[im 20/20]
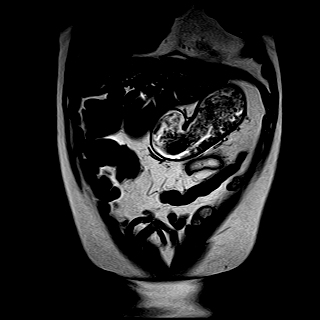

[Series 8: axial in/out phase · axial · 5.0mm · 1.48mm/px · z∈[-13,+149]mm · 3 of 28 slices shown (1 of 2)]
[im 1/28]
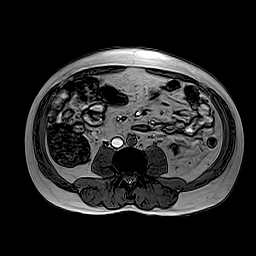
[im 14/28]
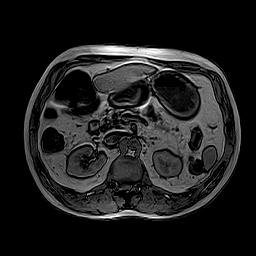
[im 28/28]
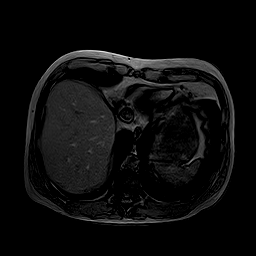

[Series 9: axial in/out phase · axial · 5.0mm · 1.48mm/px · z∈[-13,+149]mm · 2 of 28 slices shown (2 of 2)]
[im 1/28]
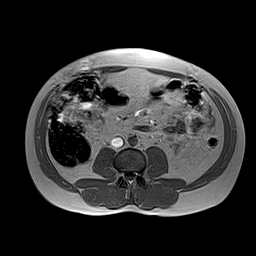
[im 28/28]
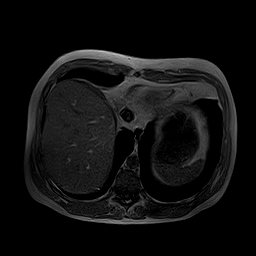

[Series 10: T2 · axial · 5.0mm · 1.48mm/px · z∈[-13,+149]mm · 2 of 28 slices shown]
[im 1/28]
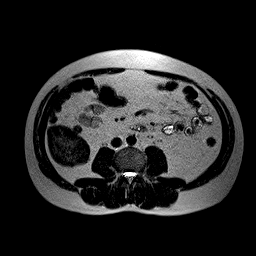
[im 28/28]
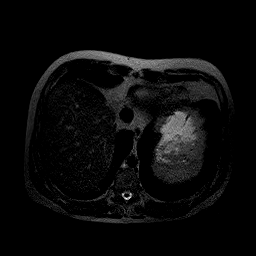

[Series 11: T2 fat-sat · axial · 5.0mm · 1.70mm/px · z∈[-1,+137]mm · 2 of 24 slices shown]
[im 1/24]
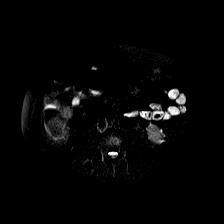
[im 24/24]
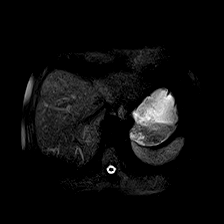

[Series 12: axial basg bh · axial · 5.0mm · 1.19mm/px · z∈[-1,+137]mm · 2 of 24 slices shown]
[im 1/24]
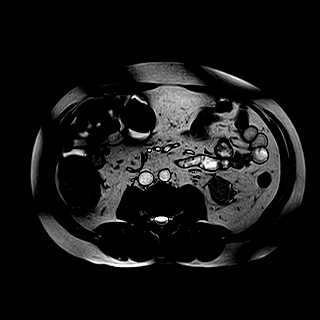
[im 24/24]
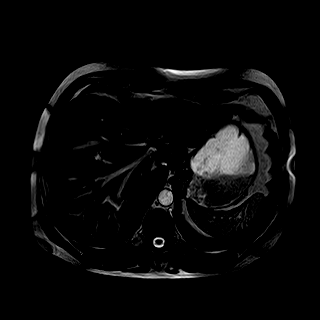

[Series 13: T1 · coronal · 5.5mm · 1.79mm/px · 1 of 16 slices shown]
[im 1/16]
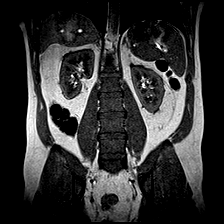

[Series 14: (person_name) pre · coronal · non-contrast · 5.0mm · 1.25mm/px · 4 of 48 slices shown]
[im 1/48]
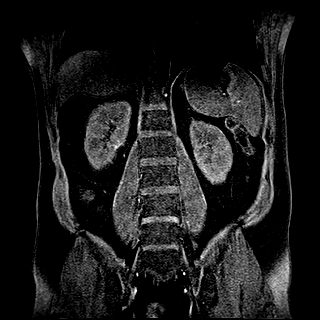
[im 16/48]
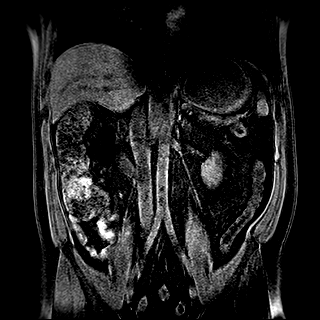
[im 32/48]
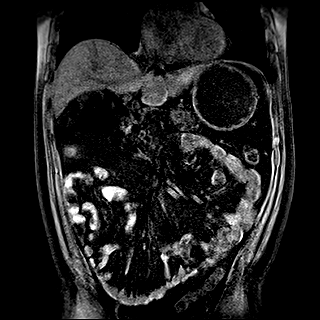
[im 48/48]
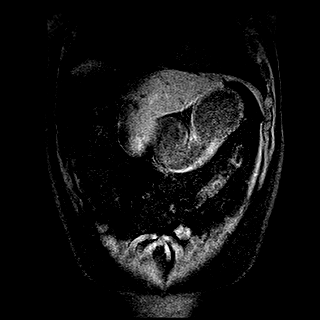

[Series 15: axial (person_name) pre · axial · non-contrast · 5.0mm · 1.19mm/px · z∈[-9,+149]mm · 5 of 64 slices shown]
[im 1/64]
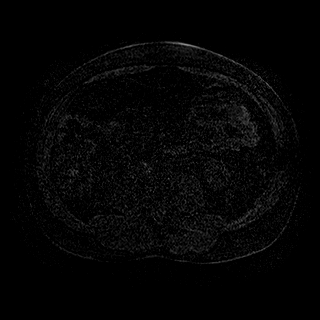
[im 16/64]
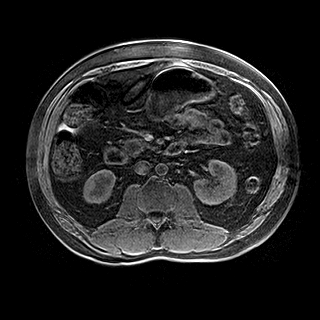
[im 32/64]
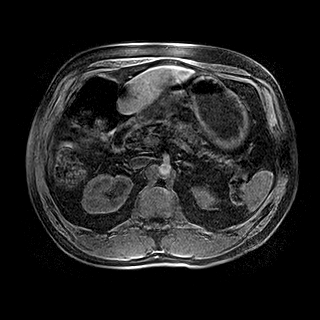
[im 48/64]
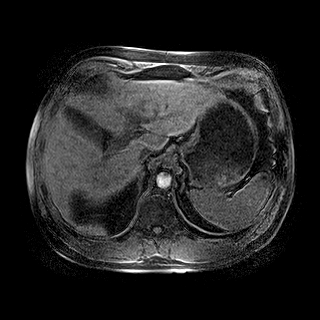
[im 64/64]
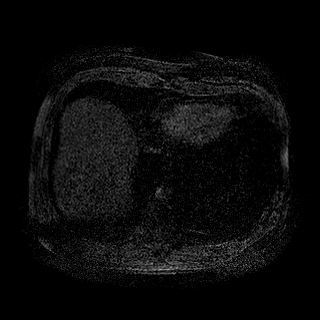

[Series 16: axial (person_name) arterial · axial · arterial · 5.0mm · 1.19mm/px · z∈[-9,+149]mm · 5 of 64 slices shown]
[im 1/64]
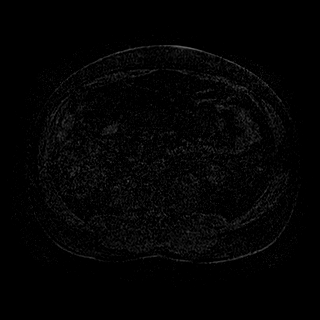
[im 16/64]
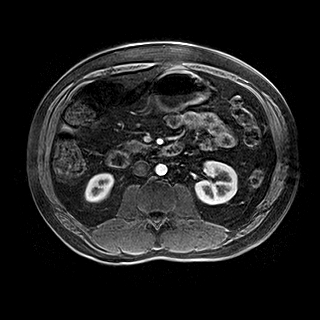
[im 32/64]
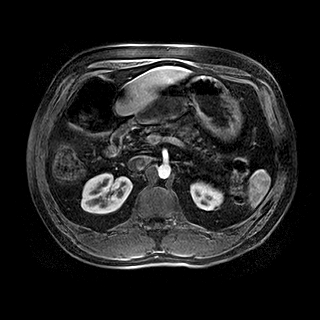
[im 48/64]
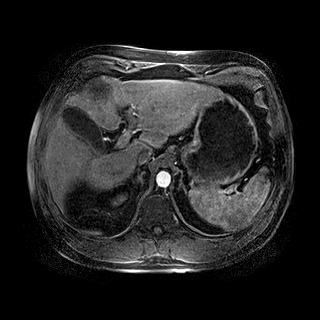
[im 64/64]
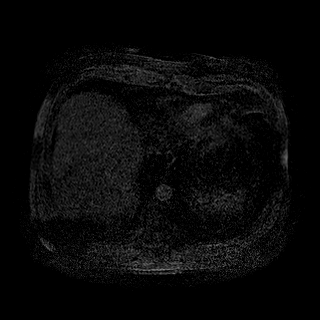

[Series 17: axial (person_name) venous · axial · portal-venous · 5.0mm · 1.19mm/px · z∈[-9,+149]mm · 5 of 64 slices shown]
[im 1/64]
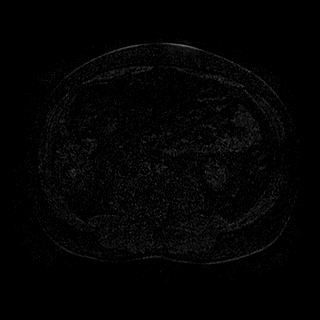
[im 16/64]
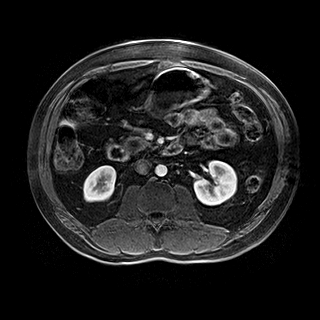
[im 32/64]
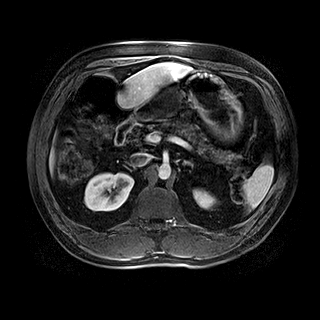
[im 48/64]
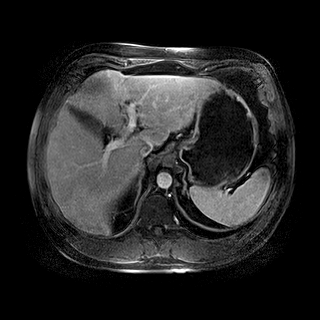
[im 64/64]
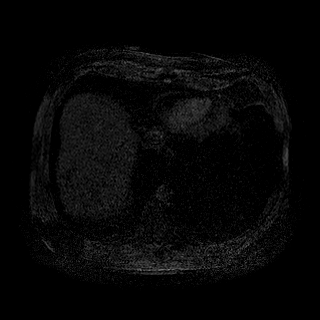

[Series 18: axial (person_name) (date) · axial · delayed · 5.0mm · 1.19mm/px · z∈[-9,+149]mm · 5 of 64 slices shown (1 of 2)]
[im 1/64]
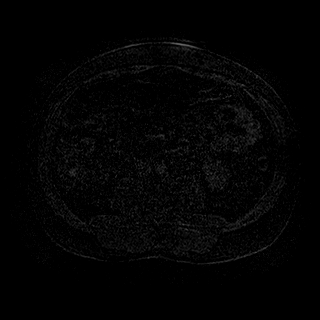
[im 16/64]
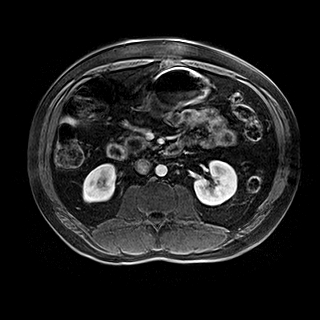
[im 32/64]
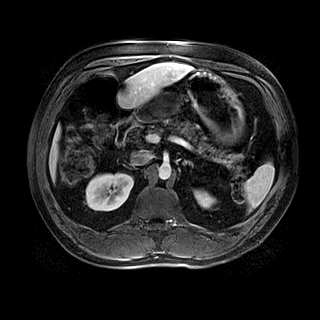
[im 48/64]
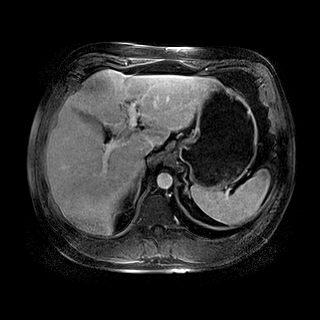
[im 64/64]
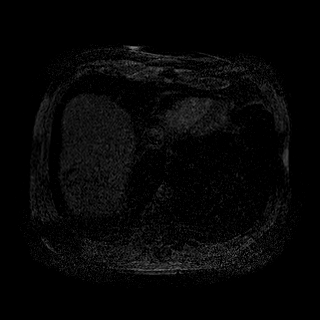

[Series 19: axial (person_name) (date) · axial · delayed · 5.0mm · 1.19mm/px · z∈[-9,+149]mm · 5 of 64 slices shown (2 of 2)]
[im 1/64]
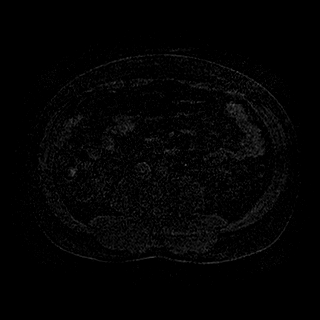
[im 16/64]
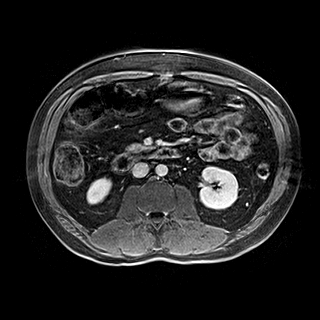
[im 32/64]
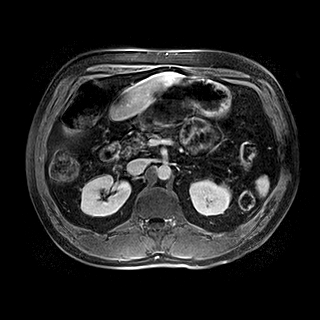
[im 48/64]
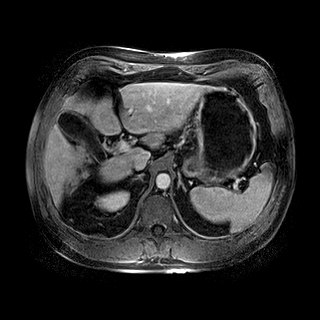
[im 64/64]
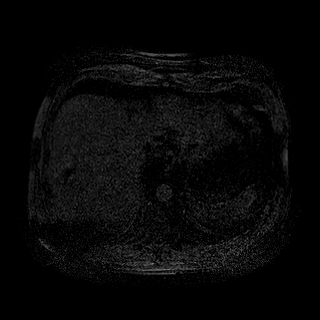

[Series 21: (person_name) 5 min · coronal · 5.0mm · 1.25mm/px · 3 of 48 slices shown]
[im 1/48]
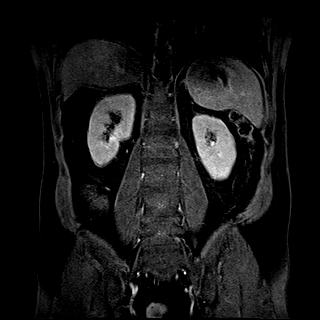
[im 16/48]
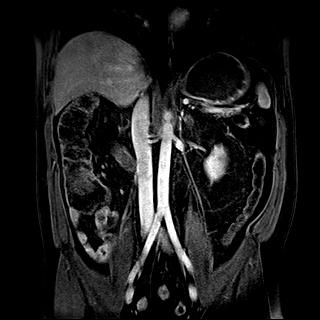
[im 32/48]
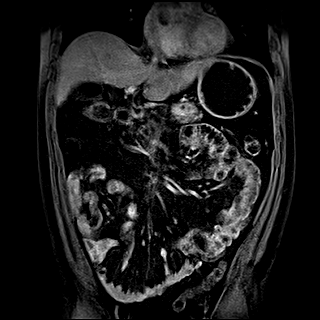

[47 of 48 positions shown; findings below may reference images not displayed]

FINDINGS: The lung bases do not show any focal lesions.  

Moderately contracted liver is noted measuring craniocaudal span of 8.5 cm at the right lobe.  Spleen measures a craniocaudal span of 11.3 cm.  

The gallbladder shows no evidence of gallstones.  Extrahepatic bile ducts are normal in size.  Portal vein and hepatic veins are patent.  

Fatty changes of the pancreas are noted with calcific foci and mildly dilated distal pancreatic duct in the body and tail measuring 3.1 mm in diameter consistent with changes of chronic pancreatitis.  

Well-defined T2 bright cystic lesion is noted 20 x 16 mm in size in the head of the pancreas at the uncinate process.  No enhancement is noted in this area on the postcontrast study including arterial phase, venous phase and delayed postcontrast phase images.  No evidence of retroperitoneal adenopathy or ascites is noted in the upper abdomen.  

Kidneys are normal on both sides.
IMPRESSION: 1. Changes of chronic pancreatitis with fatty atrophy of the pancreas are noted with mildly dilated distal pancreatic duct in the body and tail of the pancreas.  

2. Well-defined cystic lesion of the head of the pancreas 20 x 16 mm in size without solid elements or abnormal enhancement suggest chronic pancreatic pseudocyst.  Less likely differential diagnosis includes IPMN.  Follow-up evaluation is recommended by CT examination at 6 months to ensure a benign process.

3. Contracted liver particularly right lobe of the liver suggesting chronic liver disease.  There is no evidence of splenomegaly or ascites.

## 2022-04-07 ENCOUNTER — Ambulatory Visit (INDEPENDENT_AMBULATORY_CARE_PROVIDER_SITE_OTHER): Payer: Medicare Other | Admitting: Student in an Organized Health Care Education/Training Program

## 2022-04-07 ENCOUNTER — Encounter (INDEPENDENT_AMBULATORY_CARE_PROVIDER_SITE_OTHER): Payer: Self-pay
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
## 2022-05-13 IMAGING — MR MRI BRAIN WITHOUT AND WITH CONTRAST
9 of 11 series · 34 of 48 positions shown · IV contrast (gadavist)
Comparison: None available.

﻿EXAM:  58880   MRI BRAIN WITHOUT AND WITH CONTRAST
INDICATION: Epilepsy.
TECHNIQUE: Multiplanar, multisequence MRI was performed both before and after the intravenous administration of 5 mL Gadavist.

[Series 16: DWI · axial · 5.0mm · 1.35mm/px · z∈[-23,+103]mm · 9 of 88 slices shown (1 of 3)]
[im 1/88]
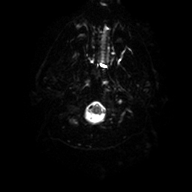
[im 15/88]
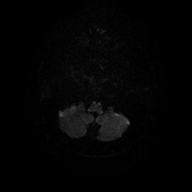
[im 30/88]
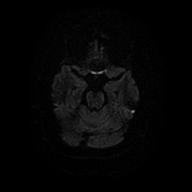
[im 37/88]
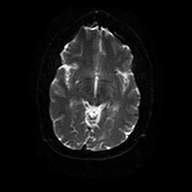
[im 44/88]
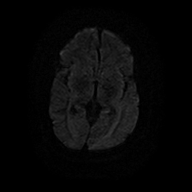
[im 51/88]
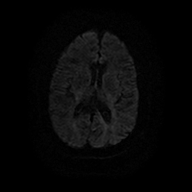
[im 59/88]
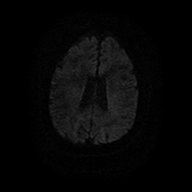
[im 73/88]
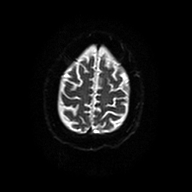
[im 88/88]
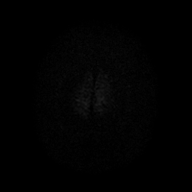

[Series 17: DWI · axial · 5.0mm · 1.35mm/px · z∈[-23,+103]mm · 3 of 22 slices shown (2 of 3)]
[im 1/22]
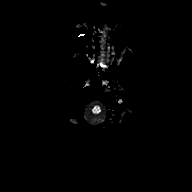
[im 11/22]
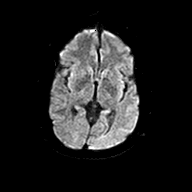
[im 22/22]
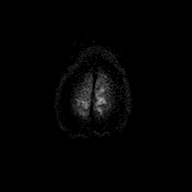

[Series 18: DWI · axial · 5.0mm · 1.35mm/px · z∈[-23,+103]mm · 3 of 22 slices shown (3 of 3)]
[im 1/22]
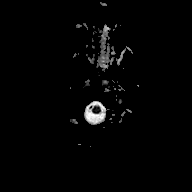
[im 11/22]
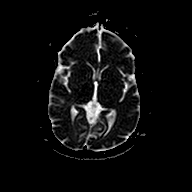
[im 22/22]
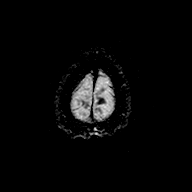

[Series 19: FLAIR · sagittal · 4.0mm · 0.75mm/px · 4 of 26 slices shown (1 of 2)]
[im 1/26]
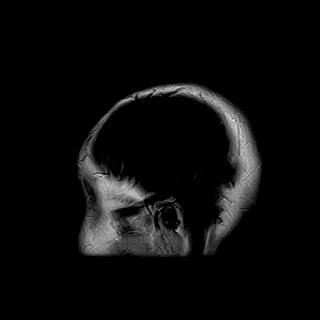
[im 9/26]
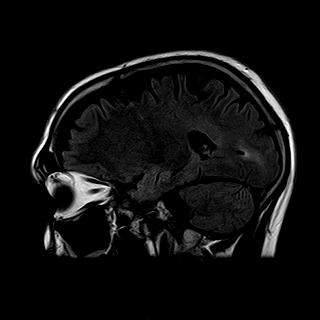
[im 17/26]
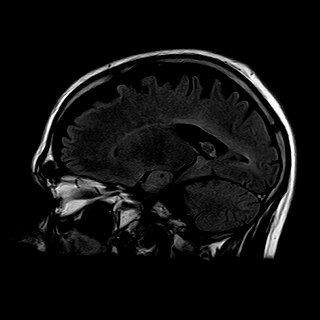
[im 26/26]
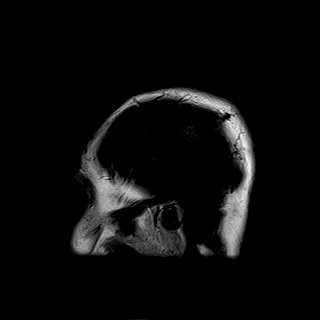

[Series 20: T2 · axial · 5.0mm · 0.43mm/px · z∈[-32,+106]mm · 3 of 24 slices shown (1 of 2)]
[im 1/24]
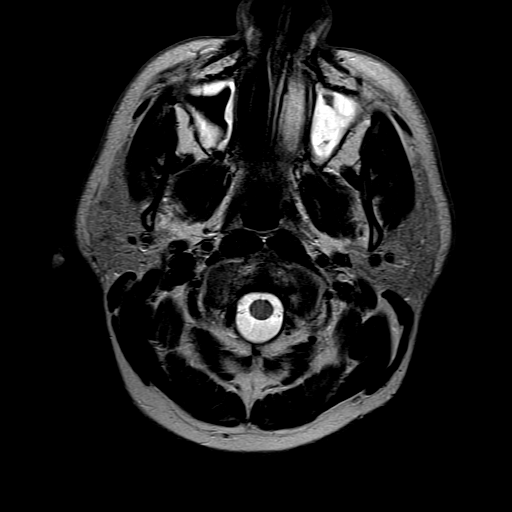
[im 12/24]
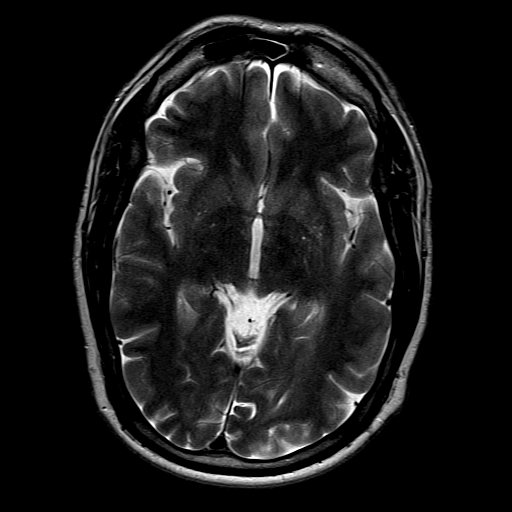
[im 24/24]
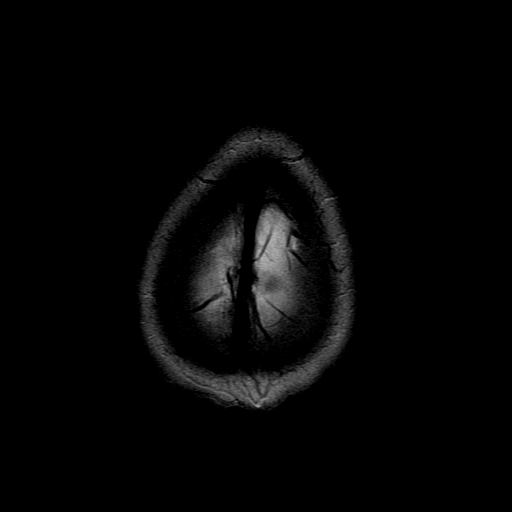

[Series 21: FLAIR · axial · 5.0mm · 0.43mm/px · z∈[-29,+115]mm · 4 of 25 slices shown (2 of 2)]
[im 1/25]
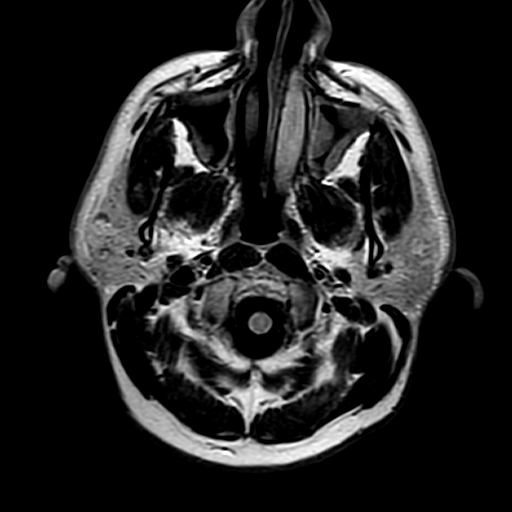
[im 9/25]
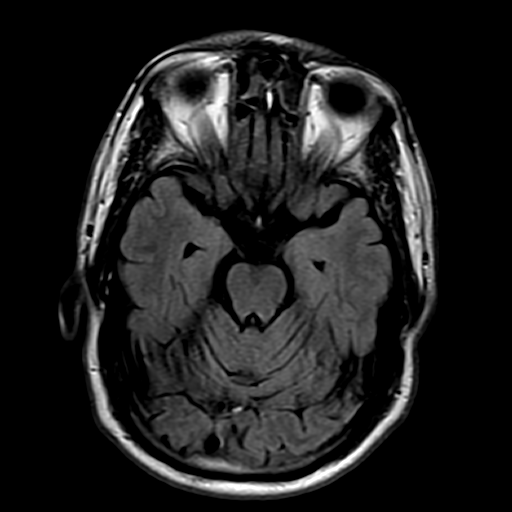
[im 17/25]
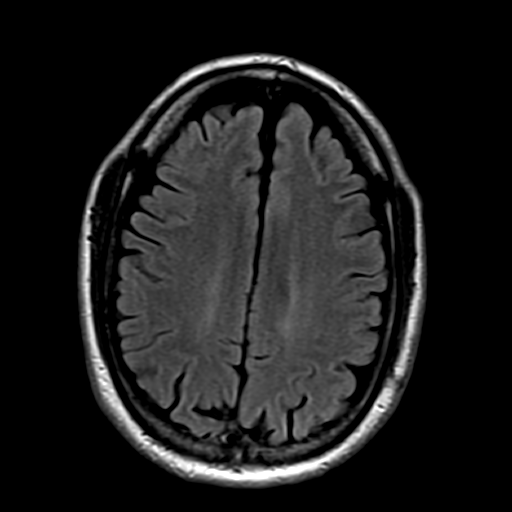
[im 25/25]
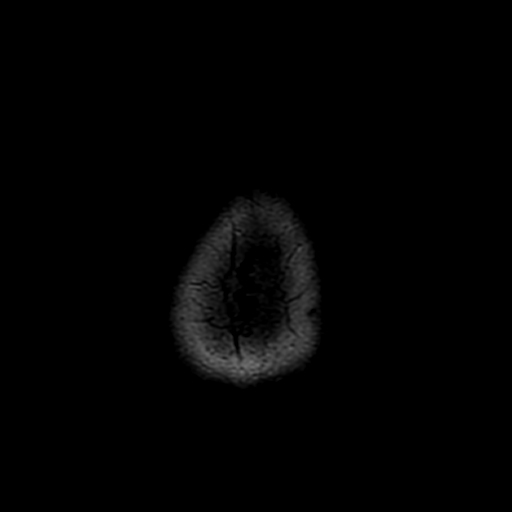

[Series 23: T2 · coronal · 6.0mm · 0.43mm/px · 3 of 24 slices shown (2 of 2)]
[im 1/24]
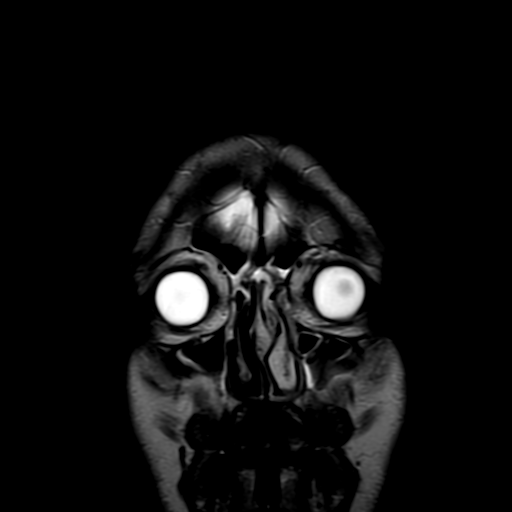
[im 12/24]
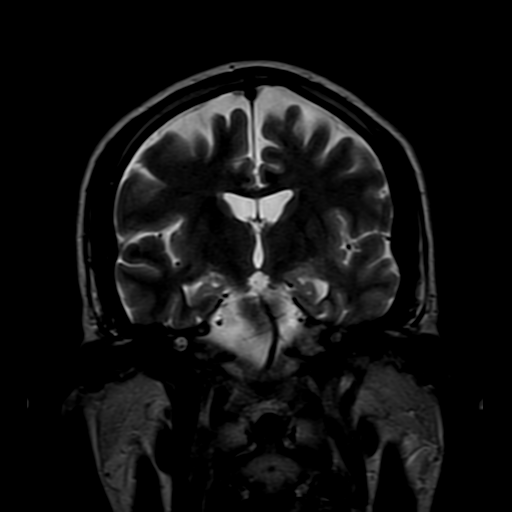
[im 24/24]
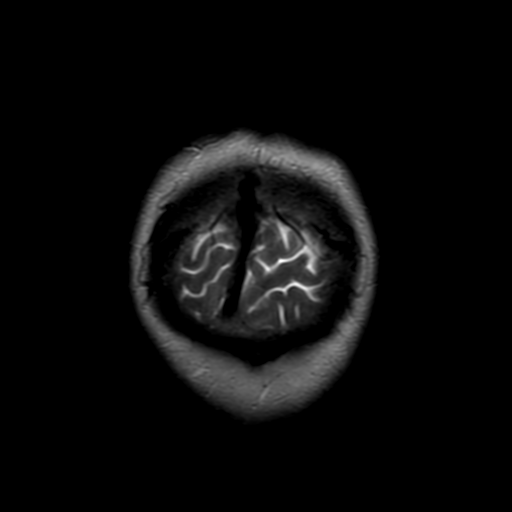

[Series 24: T1 · axial · 5.0mm · 0.43mm/px · 1 of 25 slices shown]
[im 1/25]
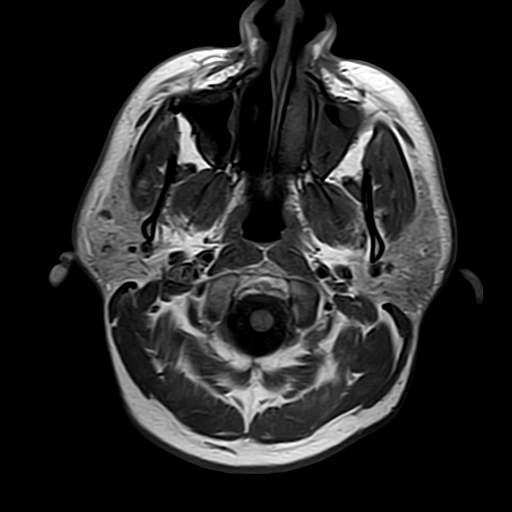

[Series 25: T1 post-contrast · axial · 5.0mm · 0.43mm/px · z∈[-29,+115]mm · 4 of 25 slices shown]
[im 1/25]
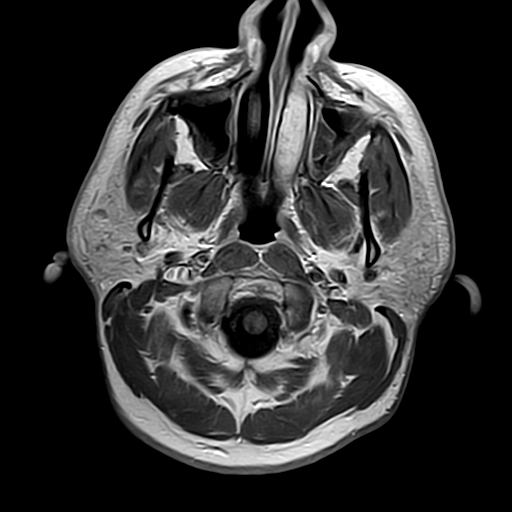
[im 9/25]
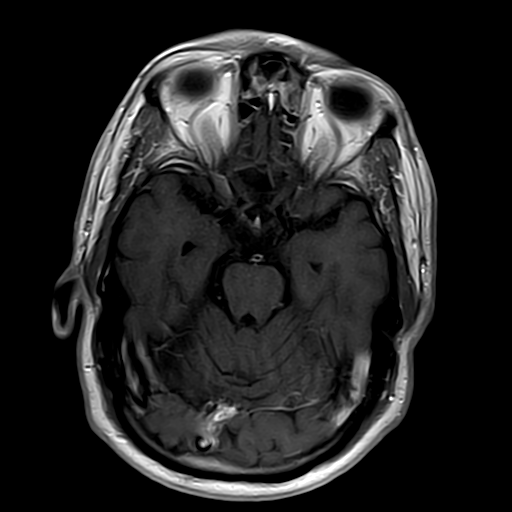
[im 17/25]
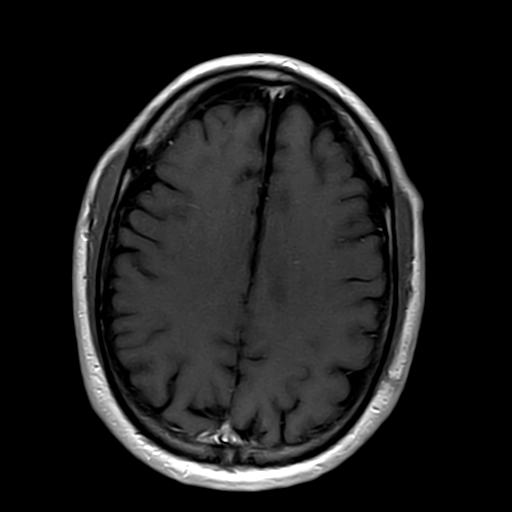
[im 25/25]
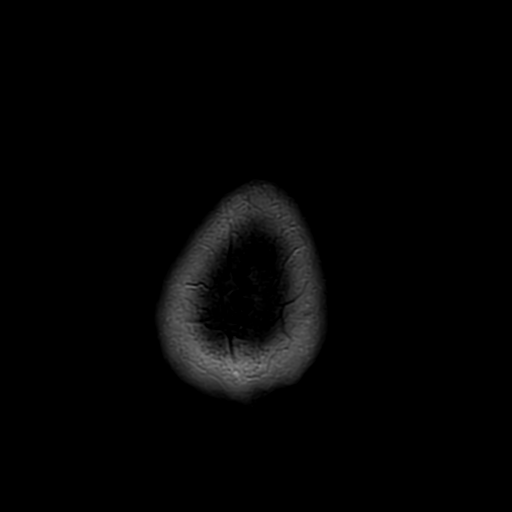

[34 of 48 positions shown; findings below may reference images not displayed]

FINDINGS: A small pineal cyst is noted.  

There is moderate chronic sinusitis.  

This examination is otherwise unremarkable.  There is no evidence of mass, hemorrhage, shift of the midline structures, extra-axial collection, or abnormal enhancement.  The ventricles and CSF spaces appear within normal limits.  

There is no significant white matter disease.  There is no definite evidence of mesial temporal sclerosis.
IMPRESSION: 1. Moderate chronic sinusitis.

2. Small pineal cyst.

3. Otherwise, normal intracranial examination.

## 2022-08-19 ENCOUNTER — Encounter (INDEPENDENT_AMBULATORY_CARE_PROVIDER_SITE_OTHER): Payer: Self-pay | Admitting: Surgery

## 2022-08-24 ENCOUNTER — Ambulatory Visit (INDEPENDENT_AMBULATORY_CARE_PROVIDER_SITE_OTHER): Payer: Self-pay | Admitting: Surgery

## 2022-09-09 ENCOUNTER — Ambulatory Visit (INDEPENDENT_AMBULATORY_CARE_PROVIDER_SITE_OTHER): Payer: Medicare Other | Admitting: Surgery

## 2022-09-09 ENCOUNTER — Other Ambulatory Visit: Payer: Self-pay

## 2022-09-09 ENCOUNTER — Encounter (INDEPENDENT_AMBULATORY_CARE_PROVIDER_SITE_OTHER): Payer: Self-pay | Admitting: Surgery

## 2022-09-09 VITALS — BP 108/73 | HR 106 | Temp 97.4°F | Ht 70.0 in | Wt 186.0 lb

## 2022-09-09 DIAGNOSIS — M6208 Separation of muscle (nontraumatic), other site: Secondary | ICD-10-CM

## 2022-09-09 DIAGNOSIS — R1013 Epigastric pain: Secondary | ICD-10-CM

## 2022-09-09 MED ORDER — PEG 3350-ELECTROLYTES 236 GRAM-22.74 GRAM-6.74 GRAM-5.86 GRAM SOLUTION
4.0000 L | Freq: Once | ORAL | 0 refills | Status: DC
Start: 2022-09-09 — End: 2022-10-08

## 2022-09-10 ENCOUNTER — Encounter (INDEPENDENT_AMBULATORY_CARE_PROVIDER_SITE_OTHER): Payer: Self-pay | Admitting: Surgery
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
## 2022-10-08 ENCOUNTER — Encounter (HOSPITAL_COMMUNITY)
Admission: RE | Disposition: A | Discharge status: Home / Self Care | Payer: Self-pay | Source: Ambulatory Visit | Attending: Surgery

## 2022-10-08 ENCOUNTER — Inpatient Hospital Stay
Admission: RE | Admit: 2022-10-08 | Discharge: 2022-10-08 | Disposition: A | Discharge status: Home / Self Care | Payer: Medicare Other | Source: Ambulatory Visit | Attending: Surgery | Admitting: Surgery

## 2022-10-08 ENCOUNTER — Encounter (HOSPITAL_COMMUNITY): Payer: Self-pay | Admitting: Surgery

## 2022-10-08 ENCOUNTER — Ambulatory Visit (HOSPITAL_COMMUNITY): Payer: Medicare Other | Admitting: Certified Registered"

## 2022-10-08 ENCOUNTER — Other Ambulatory Visit: Payer: Self-pay

## 2022-10-08 ENCOUNTER — Encounter (HOSPITAL_COMMUNITY): Payer: Medicare Other | Admitting: Surgery

## 2022-10-08 DIAGNOSIS — D649 Anemia, unspecified: Secondary | ICD-10-CM | POA: Insufficient documentation

## 2022-10-08 DIAGNOSIS — B1920 Unspecified viral hepatitis C without hepatic coma: Secondary | ICD-10-CM | POA: Insufficient documentation

## 2022-10-08 DIAGNOSIS — G4730 Sleep apnea, unspecified: Secondary | ICD-10-CM | POA: Insufficient documentation

## 2022-10-08 DIAGNOSIS — K861 Other chronic pancreatitis: Secondary | ICD-10-CM | POA: Insufficient documentation

## 2022-10-08 DIAGNOSIS — I10 Essential (primary) hypertension: Secondary | ICD-10-CM | POA: Insufficient documentation

## 2022-10-08 DIAGNOSIS — R569 Unspecified convulsions: Secondary | ICD-10-CM | POA: Insufficient documentation

## 2022-10-08 DIAGNOSIS — K648 Other hemorrhoids: Secondary | ICD-10-CM | POA: Insufficient documentation

## 2022-10-08 DIAGNOSIS — K625 Hemorrhage of anus and rectum: Secondary | ICD-10-CM | POA: Insufficient documentation

## 2022-10-08 DIAGNOSIS — R1013 Epigastric pain: Secondary | ICD-10-CM | POA: Insufficient documentation

## 2022-10-08 DIAGNOSIS — K449 Diaphragmatic hernia without obstruction or gangrene: Secondary | ICD-10-CM | POA: Insufficient documentation

## 2022-10-08 DIAGNOSIS — F319 Bipolar disorder, unspecified: Secondary | ICD-10-CM | POA: Insufficient documentation

## 2022-10-08 DIAGNOSIS — F419 Anxiety disorder, unspecified: Secondary | ICD-10-CM | POA: Insufficient documentation

## 2022-10-08 DIAGNOSIS — K259 Gastric ulcer, unspecified as acute or chronic, without hemorrhage or perforation: Secondary | ICD-10-CM | POA: Insufficient documentation

## 2022-10-08 DIAGNOSIS — Z79899 Other long term (current) drug therapy: Secondary | ICD-10-CM | POA: Insufficient documentation

## 2022-10-08 DIAGNOSIS — K219 Gastro-esophageal reflux disease without esophagitis: Secondary | ICD-10-CM | POA: Insufficient documentation

## 2022-10-08 DIAGNOSIS — Z87820 Personal history of traumatic brain injury: Secondary | ICD-10-CM | POA: Insufficient documentation

## 2022-10-08 DIAGNOSIS — K2950 Unspecified chronic gastritis without bleeding: Secondary | ICD-10-CM | POA: Insufficient documentation

## 2022-10-08 SURGERY — GASTROSCOPY WITH BIOPSY
Anesthesia: General | Wound class: Clean Contaminated Wounds-The respiratory, GI, Genital, or urinary

## 2022-10-08 MED ORDER — LIDOCAINE (PF) 100 MG/5 ML (2 %) INTRAVENOUS SYRINGE
INJECTION | Freq: Once | INTRAVENOUS | Status: DC | PRN
Start: 2022-10-08 — End: 2022-10-08
  Administered 2022-10-08: 100 mg via INTRAVENOUS

## 2022-10-08 MED ORDER — PROPOFOL 10 MG/ML INTRAVENOUS EMULSION
Freq: Once | INTRAVENOUS | Status: DC | PRN
Start: 2022-10-08 — End: 2022-10-08
  Administered 2022-10-08: 25 mL via INTRAVENOUS

## 2022-10-08 MED ORDER — SUCRALFATE 1 GRAM TABLET
1.0000 g | ORAL_TABLET | Freq: Two times a day (BID) | ORAL | 2 refills | Status: DC
Start: 2022-10-08 — End: 2022-10-27

## 2022-10-08 MED ORDER — SODIUM CHLORIDE 0.9 % INTRAVENOUS SOLUTION
INTRAVENOUS | Status: DC
Start: 2022-10-08 — End: 2022-10-08

## 2022-10-08 SURGICAL SUPPLY — 3 items
FORCEPS BIOPSY MICROMESH TTH STREAMLINE CATH NEEDLE 240CM 2.4MM RJ 4 SS LRG CPC STRL DISP ORNG 2.8MM (ENDOSCOPIC SUPPLIES) ×1 IMPLANT
USE ITEM 60432 FORCEPS BIOPSY MICROMESH TTH STREAMLINE CATH NEEDLE 240CM 2.4MM RJ 4 SS LRG CPC STRL DISP ORNG 2.8MM (ENDOSCOPIC SUPPLIES) ×1 IMPLANT
VALVE AIR/H20 DEFENDO BUTTON KIT SUCT BIOPSY STRL DISP (ENDOSCOPIC SUPPLIES) ×1 IMPLANT
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
## 2022-10-08 NOTE — Anesthesia Postprocedure Evaluation (Signed)
Anesthesia Post Op Evaluation    Patient: Matthew Valenzuela  Procedure(s):  EGD WITH BIOPSY  COLONOSCOPY to transverse colon    Last Vitals:Temperature: 36.6 C (97.8 F) (10/08/22 1019)  Heart Rate: (!) 134 (10/08/22 1019)  BP (Non-Invasive): 91/70 (10/08/22 1019)  Respiratory Rate: 16 (10/08/22 1019)  SpO2: 96 % (10/08/22 1019)    No notable events documented.    Patient is sufficiently recovered from the effects of anesthesia to participate in the evaluation and has returned to their pre-procedure level.  Patient location during evaluation: PACU       Patient participation: complete - patient participated  Level of consciousness: awake and alert and responsive to verbal stimuli    Pain score: 0  Pain management: adequate  Airway patency: patent    Anesthetic complications: no  Cardiovascular status: acceptable  Respiratory status: acceptable  Hydration status: acceptable  Patient post-procedure temperature: Pt Normothermic   PONV Status: Absent

## 2022-10-08 NOTE — Discharge Instructions (Addendum)
SURGICAL DISCHARGE INSTRUCTIONS     Dr. Shirlee More, MD  performed your EGD WITH BIOPSY, COLONOSCOPY to transverse colon today at the [facility]:  Monday through Friday from 8 a.m. - 4 p.m.   For T&D: [phone]  Between 4 p.m. - 8 a.m., weekends and holidays:  Call ER [phone]    PLEASE SEE WRITTEN HANDOUTS AS DISCUSSED BY YOUR NURSE:  Trishna Cwik    SIGNS AND SYMPTOMS OF A WOUND / INCISION INFECTION   Be sure to watch for the following:  Increase in redness or red streaks near or around the wound or incision.  Increase in pain that is intense or severe and cannot be relieved by the pain medication that your doctor has given you.  Increase in swelling that cannot be relieved by elevation of a body part, or by applying ice, if permitted.  Increase in drainage, or if yellow / green in color and smells bad. This could be on a dressing or a cast.  Increase in fever for longer than 24 hours, or an increase that is higher than 101 degrees Fahrenheit (normal body temperature is 98 degrees Fahrenheit). The incision may feel warm to the touch.    **CALL YOUR DOCTOR IF ONE OR MORE OF THESE SIGNS / SYMPTOMS SHOULD OCCUR.    ANESTHESIA INFORMATION   ANESTHESIA -- ADULT PATIENTS:  You have received intravenous sedation / general anesthesia, and you may feel drowsy and light-headed for several hours. You may even experience some forgetfulness of the procedure. DO NOT DRIVE A MOTOR VEHICLE or perform any activity requiring complete alertness or coordination until you feel fully awake in about 24-48 hours. Do not drink alcoholic beverages for at least 24 hours. Do not stay alone, you must have a responsible adult available to be with you. You may also experience a dry mouth or nausea for 24 hours. This is a normal side effect and will disappear as the effects of the medication wear off.    REMEMBER   If you experience any difficulty breathing, chest pain, bleeding that you  feel is excessive, persistent nausea or vomiting or for any other concerns:  Call your physician Dr.  Shirlee More, MD   at [phone] . You may also ask to have the general doctor on call paged. They are available to you 24 hours a day.      SPECIAL INSTRUCTIONS / COMMENTS   YOUR TESTS TODAY SHOWED MULTIPLE ULCERS, EROSIVE GASTRITIS, A HIATAL HERNIA ON THE TOP. ON THE BOTTOM YOU HAD INTERNAL HEMORRHOIDS AND NOT CLEANED OUT VERY WELL. DR MULLINS IS GOING TO SEND PRESCRIPTIONS IN TO YOUR PHARMACY FOR THE ULCERS. PLEASE TAKE THEM AS DIRECTED BY PHARMACY.      Dr Shirlee More

## 2022-10-08 NOTE — Interval H&P Note (Signed)
[facility]      H&P UPDATE FORM                                                                                  Cowan, Pilar, 37 y.o. male  Date of Admission:  10/08/2022  Date of Birth:  1985/10/14    10/08/2022    STOP: IF H&P IS GREATER THAN 30 DAYS FROM SURGICAL DAY COMPLETE NEW H&P IS REQUIRED.     H & P updated the day of the procedure.  1.  H&P completed within 30 days of surgical procedure and has been reviewed within 24 hours of admission but prior to surgery or a procedure requiring anesthesia services, the patient has been examined, and no change has occured in the patients condition since the H&P was completed.       Change in medications: No              Comments:     2.  Patient continues to be appropriate candidate for planned surgical procedure. YES    Shirlee More, MD

## 2022-10-08 NOTE — OR Surgeon (Signed)
[facility]    Patient Name: Matthew Valenzuela, Matthew Valenzuela Number: [id number]  Date of Service: 10/08/2022   Date of Birth: 12-10-1985      Pre-Operative Diagnosis: EPIGASTRIC PAIN  RECTAL BLEEDING     Post-Operative Diagnosis: Superficial duodenal and antral ulcers  Erosive Gastritis  2cm Hiatal Hernia    Poor prep  Internal Hemorrhoids    Procedure(s)/Description:  EGD WITH BIOPSY: 06269 (CPT)  COLONOSCOPY to transverse colon: 48546 (CPT)     Attending Surgeon: Shirlee More, MD     Anesthesia:  CRNA: Carmina Miller, CRNA    Anesthesia Type: .General     Specimen:  Antrum    Patient was taken to the endoscopy suite.  Given appropriate intravenous sedation.  Video gastroscope inserted into the posterior pharynx and directed distally into the esophagus.  Esophagus was transversed and the stomach entered without difficulty.  The stomach was insufflated with air.  Scope was advanced to the level of the pylorus.  Pylorus was cannulated.  The first and second portion of the duodenum visualized with multiple superficial duodenal ulcers  Scope was withdrawn back into the distal stomach.  Single biopsy was taken for H. pylori at this level.  There were superficial antral ulcers noted with erosive changes of gastritis .  The scope was withdrawn back.  The body and the fundus showed no specific abnormality.  The scope was retroflexed.  The gastroesophageal junction visualized from below.  There was a 2 cm hiatal hernia and no other specific abnormality.  Scope was then withdrawn back to the main course of the esophageal lumen.  There is no significant irregularity at the level of the squamocolumnar junction.  No signs of esophagitis.  There is no luminal constriction, or any other abnormality noted upon withdrawing back through the esophageal lumen.  Scope was then withdrawn.  Patient was turned.  Video colonoscope inserted in the rectum.  Immediately noted formed stool.  Tried to irrigate and was able to  advance the level of a proximally the transverse colon with the degree of formed stool at that point precluding any viable visualization.  Withdrew back with no noted abnormality with the prep limitations noted.  There were internal hemorrhoids noted within the rectum.  Bridgett Larsson MD MBA CPE FACS      Bridgett Larsson MD MBA CPE FACS

## 2022-10-09 ENCOUNTER — Telehealth (INDEPENDENT_AMBULATORY_CARE_PROVIDER_SITE_OTHER): Payer: Self-pay | Admitting: Surgery

## 2022-10-09 LAB — SURGICAL PATHOLOGY SPECIMEN

## 2022-10-24 NOTE — Progress Notes (Unsigned)
[facility]    Progress Note    Name: Matthew Valenzuela MRN:  [id number]   Date: 10/26/2022 Age: 37 y.o.          Date of Birth:  03-31-1986  PCP: Zadie Rhine, FNP  Referring:  No ref. provider found     HPI:  Matthew Valenzuela is a 37 y.o. White male who returns following EGD.  EGD performed by me October 08, 2022.  Findings at that time showed multiple superficial duodenal and antral ulcers, erosive gastritis and a 2 cm hiatal hernia.  Tolerated procedure without difficulty.  In addition had an attempted colonoscopy but had almost no colon prep that allowed adequate visualization.    Was started on Carafate following his above procedure.  He states he had no improvement from this and has apparently not taken it for any period of time following the procedure.  Is continuing to take his Prilosec and Pepcid.  He states he has no improvement with his symptoms following this.  Continues to have ongoing epigastric abdominal pain.  Seems to be more prominent after eating.        Past Medical History:   Diagnosis Date    Anemia     Anxiety     Bipolar disorder, unspecified (CMS HCC)     Chronic pancreatitis (CMS HCC)     Erectile dysfunction     Esophageal reflux     Fatty liver     Hepatitis C     Hypertension     Seizure (CMS HCC)     Sleep apnea     Tachycardia     Vitamin D deficiency       Past Surgical History:   Procedure Laterality Date    COLONOSCOPY      ESOPHAGOGASTRODUODENOSCOPY        Outpatient Medications Marked as Taking for the 10/26/22 encounter (Office Visit) with Shirlee More, MD   Medication Sig    ALPRAZolam Duanne Moron) 1 mg Oral Tablet Take 1/2 to 1 Tablet DAILY AS NEEDED for severe social anxiety    amLODIPine (NORVASC) 2.5 mg Oral Tablet Take 1 Tablet (2.5 mg total) by mouth Once a day    atorvastatin (LIPITOR) 20 mg Oral Tablet Take 1 Tablet (20 mg total) by mouth Every night    baclofen (LIORESAL) 10 mg Oral Tablet Take 1 Tablet (10 mg total) by  mouth Three times a day as needed    famotidine (PEPCID) 20 mg Oral Tablet Take 1 Tablet (20 mg total) by mouth Twice daily    gabapentin (NEURONTIN) 300 mg Oral Capsule TAKE 1 CAPSULE by mouth IN THE MORNING AND 1 CAPSULE before bed    levETIRAcetam (KEPPRA) 500 mg Oral Tablet Take 1 Tablet (500 mg total) by mouth    omeprazole (PRILOSEC) 40 mg Oral Capsule, Delayed Release(E.C.) Take 1 Capsule (40 mg total) by mouth Once a day    propranoloL (INDERAL LA) 60 mg Oral Capsule,Sustained Action 24 hr Take 1 Capsule (60 mg total) by mouth Once a day    QUEtiapine (SEROQUEL) 300 mg Oral Tablet 1 Tablet (300 mg total)    VRAYLAR 3 mg Oral Capsule Take 1 Capsule (3 mg total) by mouth Once a day      Allergies   Allergen Reactions    Morphine Rash    Amitriptyline Nausea/ Vomiting     States muscle spasms           BP Marland Kitchen)  137/96 (Site: Left, Patient Position: Sitting)   Pulse 97   Temp 36.2 C (97.1 F)   Ht 1.778 m (5' 10"$ )   Wt 83.7 kg (184 lb 9.6 oz)   SpO2 96%   BMI 26.49 kg/m          General: appropriate for age. in no acute distress.    Vital signs are present above and have been reviewed by me     HEENT: Atraumatic, Normocephalic.    Lungs: Nonlabored breathing with symmetric expansion    Heart:Regular wth respect to rate and rythmn.    Abdomen:Soft. Nontender. Nondistended     Psychiatric: Alert and oriented to person, place, and time. affect appropriate       Assessment/Plan:  Assessment/Plan   1. Peptic ulcer disease    2. Abdominal pain, unspecified abdominal location         Current symptoms not controlled with his medications.  No improvement with Carafate although I have some doubts of how long he took this before making that decision.  Typically a 6 week trial is more warranted before assuming that is not going to improve this.  For now we are going to increase his Prilosec to twice a day.  Going to go ahead and obtain a HIDA scan but we do currently have a diagnosis of peptic ulcer disease which  would need to be documented to have resolved before we would consider any other intervention.      This note was partially created using voice recognition software and is inherently subject to errors including those of syntax and "sound alike " substitutions which may escape proof reading. In such instances, original meaning may be extrapolated by contextual derivation.    Bridgett Larsson MD MBA CPE FACS

## 2022-10-26 ENCOUNTER — Encounter (INDEPENDENT_AMBULATORY_CARE_PROVIDER_SITE_OTHER): Payer: Self-pay | Admitting: Surgery

## 2022-10-26 ENCOUNTER — Other Ambulatory Visit: Payer: Self-pay

## 2022-10-26 ENCOUNTER — Ambulatory Visit (INDEPENDENT_AMBULATORY_CARE_PROVIDER_SITE_OTHER): Payer: Medicare Other | Admitting: Surgery

## 2022-10-26 VITALS — BP 137/96 | HR 97 | Temp 97.1°F | Ht 70.0 in | Wt 184.6 lb

## 2022-10-26 DIAGNOSIS — R110 Nausea: Secondary | ICD-10-CM

## 2022-10-26 DIAGNOSIS — K279 Peptic ulcer, site unspecified, unspecified as acute or chronic, without hemorrhage or perforation: Secondary | ICD-10-CM

## 2022-10-26 DIAGNOSIS — R109 Unspecified abdominal pain: Secondary | ICD-10-CM

## 2022-10-27 ENCOUNTER — Encounter (INDEPENDENT_AMBULATORY_CARE_PROVIDER_SITE_OTHER): Payer: Self-pay | Admitting: Surgery

## 2022-11-17 ENCOUNTER — Inpatient Hospital Stay
Admission: RE | Admit: 2022-11-17 | Discharge: 2022-11-17 | Disposition: A | Discharge status: Home / Self Care | Payer: Medicare Other | Source: Ambulatory Visit | Attending: Surgery | Admitting: Surgery

## 2022-11-17 ENCOUNTER — Other Ambulatory Visit: Payer: Self-pay

## 2022-11-17 DIAGNOSIS — R109 Unspecified abdominal pain: Secondary | ICD-10-CM | POA: Insufficient documentation

## 2022-11-17 MED ORDER — DIGOXIN 250 MCG/ML (0.25 MG/ML) INJECTION SOLUTION
125.0000 ug | INTRAMUSCULAR | Status: DC
Start: 2022-11-17 — End: 2022-11-18

## 2022-11-17 MED ORDER — SINCALIDE 5 MCG SOLUTION FOR INJECTION
1.6000 ug | INTRAMUSCULAR | Status: DC
Start: 2022-11-17 — End: 2022-11-18

## 2022-11-17 MED ORDER — SINCALIDE 5 MCG SOLUTION FOR INJECTION
INTRAMUSCULAR | Status: AC
Start: 2022-11-17 — End: 2022-11-17
  Filled 2022-11-17: qty 5

## 2023-01-06 ENCOUNTER — Ambulatory Visit (INDEPENDENT_AMBULATORY_CARE_PROVIDER_SITE_OTHER): Payer: Self-pay | Admitting: Physician Assistant

## 2023-01-18 ENCOUNTER — Other Ambulatory Visit (INDEPENDENT_AMBULATORY_CARE_PROVIDER_SITE_OTHER): Payer: Self-pay | Admitting: Surgery

## 2023-02-17 ENCOUNTER — Ambulatory Visit (INDEPENDENT_AMBULATORY_CARE_PROVIDER_SITE_OTHER): Payer: Medicare Other | Admitting: Physician Assistant

## 2023-02-17 ENCOUNTER — Encounter (INDEPENDENT_AMBULATORY_CARE_PROVIDER_SITE_OTHER): Payer: Self-pay

## 2023-02-17 NOTE — Progress Notes (Deleted)
UROLOGY, [facility]  [address] [place] [address]    Progress Note    Name: Matthew Valenzuela MRN:  [id number]   Date: 02/17/2023 DOB:  November 18, 1985 (37 y.o.)             Chief Complaint: No chief complaint on file.  Subjective   Subjective:   Matthew Valenzuela is a pleasant 37 y.o. White male whom presents to the clinic for hematuria, ED, and urinary tract infection October 27, 2022 with growth of E coli, pansensitive.      Patient denies personal or family history of urinary malignancy.    Patient denies occupational chemical exposure.    Patient denies history of tobacco use.    Patient denies fevers, chills, nausea, vomiting, hematuria, dysuria, flank pain, incontinence, dribbling, hesitancy, suprapubic pain, headaches, vision changes, shortness of breath, chest pain.             Objective   Objective :  There were no vitals taken for this visit.      Gen: NAD, alert  Pulm: unlabored at rest  CV: palpable pulses  Abd: soft, Nt/ND  GU: no suprapubic tenderness, no CVAT   Data reviewed:        Current Outpatient Medications   Medication Sig    ALPRAZolam (XANAX) 1 mg Oral Tablet Take 1/2 to 1 Tablet DAILY AS NEEDED for severe social anxiety    amLODIPine (NORVASC) 2.5 mg Oral Tablet Take 1 Tablet (2.5 mg total) by mouth Once a day    atorvastatin (LIPITOR) 20 mg Oral Tablet Take 1 Tablet (20 mg total) by mouth Every night    baclofen (LIORESAL) 10 mg Oral Tablet Take 1 Tablet (10 mg total) by mouth Three times a day as needed    famotidine (PEPCID) 20 mg Oral Tablet Take 1 Tablet (20 mg total) by mouth Twice daily    gabapentin (NEURONTIN) 300 mg Oral Capsule TAKE 1 CAPSULE by mouth IN THE MORNING AND 1 CAPSULE before bed    levETIRAcetam (KEPPRA) 500 mg Oral Tablet Take 1 Tablet (500 mg total) by mouth    omeprazole (PRILOSEC) 40 mg Oral Capsule, Delayed Release(E.C.) Take 1 Capsule (40 mg total) by mouth Once a day    propranoloL (INDERAL LA) 60 mg Oral Capsule,Sustained Action 24 hr Take 1  Capsule (60 mg total) by mouth Once a day    QUEtiapine (SEROQUEL) 300 mg Oral Tablet 1 Tablet (300 mg total)    VRAYLAR 3 mg Oral Capsule Take 1 Capsule (3 mg total) by mouth Once a day        Assessment/Plan  Problem List Items Addressed This Visit    None    Hematuria  I discussed the nature of patient's dipstick pseudohematuria as well as potential causes of false positive diagnosis of hematuria.  We discussed the American Urological Association guideline recommendation of further urological evaluation for asymptomatic patients only for at least 3 red blood cells per high power field  In the absence of microscopic hematuria and/or gross hematuria no further workup is indicated    Erectile dysfunction  I discussed the differential diagnosis, pathophysiology and nature of erectile dysfunction, including contributing risk factors in patient's case  I counseled patient on lifestyle modifications including regular cardiovascular exercise, tight glycemic control, maintenance of a healthy weight, moderate alcohol intake and smoking cessation, if applicable  Each of the various following erectile dysfunction treatment options, including risks, benefits and instructions on administration were provided to  patient  Phosphodiesterase-5 inhibitor oral therapy (Sildenafil, Vardenafil, Tadalafil) - discussed potential risks of nasal congestion, headache, vision impairment including blue-green discoloration, and/or undesired therapeutic benefit  Intracavernous injection therapy (Caverject, Edex) & intraurethral suppository (MUSE) - discussed potential risks of priapism, penile scarring with subsequent curvature, dysuria, bleeding, pain and/or undesired therapeutic benefit  Vacuum erection device - discussed potential risks of bruising, skin breakdown, penile pain, anejaculation and temporary penile numbness  Penile prosthesis surgery - discussed risks of pain, urethral injury, prosthetic infection requiring explantation, device  malfunction, and  patient/partner disatisfaction      Rieley Khalsa, PA-C

## 2024-02-19 NOTE — Progress Notes (Unsigned)
 GENERAL SURGERY, [facility]  [address]  [place] [address]    Progress Note    Name: Matthew Valenzuela MRN:  [id number]   Date: 02/21/2024 DOB:  [birth year] (37 y.o.)              Date of Birth:  [birth year]  PCP: Michaeleen Adler, FNP  Referring:  Michaeleen Adler     HPI:  Matthew Valenzuela is a 38 y.o. White male who returns***     Patient was previously evaluated by me January 2024 and noted to have a diastasis rectus with the appropriate management discussion at that time.        Past Medical History:   Diagnosis Date    Anemia     Anxiety     Bipolar disorder, unspecified (CMS HCC)     Chronic pancreatitis (CMS HCC)     Erectile dysfunction     Esophageal reflux     Fatty liver     Hepatitis C     Hypertension     Seizure (CMS HCC)     Sleep apnea     Tachycardia     Vitamin D deficiency       Past Surgical History:   Procedure Laterality Date    COLONOSCOPY      ESOPHAGOGASTRODUODENOSCOPY        No outpatient medications have been marked as taking for the 02/21/24 encounter (Appointment) with Verl Glatter, MD.      Allergies[1]        There were no vitals taken for this visit.         General: appropriate for age. in no acute distress.    Vital signs are present above and have been reviewed by me     HEENT: Atraumatic, Normocephalic.    Lungs: Nonlabored breathing with symmetric expansion    Heart:Regular wth respect to rate.    Abdomen:Soft. Nontender. Nondistended     Psychiatric: Alert and oriented to person, place, and time. affect appropriate       Assessment/Plan:  Assessment/Plan   1. Diastasis of rectus abdominis         ***      This note was partially created using voice recognition software and is inherently subject to errors including those of syntax and sound alike  substitutions which may escape proof reading. In such instances, original meaning may be extrapolated by contextual derivation.    Alica Inks MD MBA CPE FACS         [1]   Allergies  Allergen  Reactions    Morphine Rash    Amitriptyline Nausea/ Vomiting     States muscle spasms

## 2024-02-21 ENCOUNTER — Other Ambulatory Visit (INDEPENDENT_AMBULATORY_CARE_PROVIDER_SITE_OTHER): Payer: Self-pay | Admitting: Surgery

## 2024-02-21 ENCOUNTER — Other Ambulatory Visit: Payer: Self-pay

## 2024-02-21 ENCOUNTER — Ambulatory Visit (INDEPENDENT_AMBULATORY_CARE_PROVIDER_SITE_OTHER): Payer: Self-pay | Admitting: Surgery

## 2024-02-21 ENCOUNTER — Ambulatory Visit: Attending: Surgery

## 2024-02-21 ENCOUNTER — Encounter (INDEPENDENT_AMBULATORY_CARE_PROVIDER_SITE_OTHER): Payer: Self-pay | Admitting: Surgery

## 2024-02-21 VITALS — BP 126/95 | HR 91 | Temp 98.0°F | Ht 70.0 in | Wt 195.0 lb

## 2024-02-21 DIAGNOSIS — K439 Ventral hernia without obstruction or gangrene: Secondary | ICD-10-CM

## 2024-02-21 DIAGNOSIS — K429 Umbilical hernia without obstruction or gangrene: Secondary | ICD-10-CM

## 2024-02-21 DIAGNOSIS — Z01818 Encounter for other preprocedural examination: Secondary | ICD-10-CM

## 2024-02-21 DIAGNOSIS — M6208 Separation of muscle (nontraumatic), other site: Secondary | ICD-10-CM

## 2024-02-21 LAB — CBC WITH DIFF
BASOPHIL #: 0 10*3/uL (ref 0.00–0.10)
BASOPHIL %: 1 % (ref 0–1)
EOSINOPHIL #: 0.2 10*3/uL (ref 0.00–0.60)
EOSINOPHIL %: 3 % (ref 1–8)
HCT: 42.6 % (ref 36.7–47.1)
HGB: 14.5 g/dL (ref 12.5–16.3)
LYMPHOCYTE #: 2.3 10*3/uL (ref 1.00–3.00)
LYMPHOCYTE %: 39 % (ref 15–43)
MCH: 28.7 pg (ref 23.8–33.4)
MCHC: 34 g/dL (ref 32.5–36.3)
MCV: 84.2 fL (ref 73.0–96.2)
MONOCYTE #: 0.6 10*3/uL (ref 0.30–1.10)
MONOCYTE %: 10 % (ref 6–14)
MPV: 9.2 fL (ref 7.4–11.4)
NEUTROPHIL #: 2.7 10*3/uL (ref 1.85–7.84)
NEUTROPHIL %: 47 % (ref 44–74)
PLATELETS: 179 10*3/uL (ref 140–440)
RBC: 5.06 10*6/uL (ref 4.06–5.63)
RDW: 15 % (ref 12.1–16.2)
WBC: 5.8 10*3/uL (ref 3.6–10.2)

## 2024-02-21 LAB — BASIC METABOLIC PANEL
ANION GAP: 11 mmol/L (ref 4–13)
BUN/CREA RATIO: 15 (ref 6–22)
BUN: 15 mg/dL (ref 7–25)
CALCIUM: 8.7 mg/dL (ref 8.6–10.3)
CHLORIDE: 105 mmol/L (ref 98–107)
CO2 TOTAL: 24 mmol/L (ref 21–31)
CREATININE: 0.97 mg/dL (ref 0.60–1.30)
ESTIMATED GFR: 103 mL/min/{1.73_m2} (ref >59)
GLUCOSE: 120 mg/dL — ABNORMAL HIGH (ref 74–109)
OSMOLALITY, CALCULATED: 282 mosm/kg (ref 270–290)
POTASSIUM: 3.7 mmol/L (ref 3.5–5.1)
SODIUM: 140 mmol/L (ref 136–145)

## 2024-02-21 NOTE — H&P (View-Only) (Signed)
 GENERAL SURGERY, [facility]  [address]  [place] [address]    History and Physical    Name: Matthew Valenzuela MRN:  [id number]   Date: 02/21/2024 DOB:  1986/08/03 (38 y.o.)                  Reason for Visit: Hernia    History of Present Illness  Mr. [name] who returns repeat evaluation of his abdominal wall.  Patient was previously evaluated by me January 2024 and felt to have a diastasis rectus with the appropriate management discussion at that time.    He states his abdominal wall exam has changed since that time.  Has developed an increasing prominence in the epigastrium that is seems to be present even without changes in position/Valsalva that was the primary cause at the time of his previous examination.    Has had no prior hernia repair.  Has had no previous operative intervention to his abdominal wall.    Is scheduled for a CT scan later this week        MEDICAL DECISION:    Review of prior external note(s) from each unique source:  Patients referral to this office including a recent assessment by the referring provider.  This was reviewed by me for this unique office visit for the indication and intent of the referral as well as any pertinent medical or surgical history relevant to the patients independent evaluation by me today.        Patient Data  Patient History  Past Medical History:   Diagnosis Date    Anemia     Anxiety     Bipolar disorder, unspecified     Chronic pancreatitis     Erectile dysfunction     Esophageal reflux     Fatty liver     Hepatitis C     Hypertension     Seizure (CMS HCC)     Sleep apnea     Tachycardia     Vitamin D deficiency          Past Surgical History:   Procedure Laterality Date    COLONOSCOPY      ESOPHAGOGASTRODUODENOSCOPY           Current Outpatient Medications   Medication Sig    ALPRAZolam (XANAX) 1 mg Oral Tablet Take 1/2 to 1 Tablet DAILY AS NEEDED for severe social anxiety    amLODIPine (NORVASC) 2.5 mg Oral Tablet Take 1  Tablet (2.5 mg total) by mouth Once a day    atorvastatin (LIPITOR) 20 mg Oral Tablet Take 1 Tablet (20 mg total) by mouth Every night    baclofen (LIORESAL) 10 mg Oral Tablet Take 1 Tablet (10 mg total) by mouth Three times a day as needed    famotidine (PEPCID) 20 mg Oral Tablet Take 1 Tablet (20 mg total) by mouth Twice daily (Patient not taking: Reported on 02/21/2024)    gabapentin (NEURONTIN) 300 mg Oral Capsule TAKE 1 CAPSULE by mouth IN THE MORNING AND 1 CAPSULE before bed    levETIRAcetam (KEPPRA) 500 mg Oral Tablet Take 1 Tablet (500 mg total) by mouth    omeprazole (PRILOSEC) 40 mg Oral Capsule, Delayed Release(E.C.) Take 1 Capsule (40 mg total) by mouth Once a day    propranoloL (INDERAL LA) 60 mg Oral Capsule,Sustained Action 24 hr Take 1 Capsule (60 mg total) by mouth Once a day    QUEtiapine (SEROQUEL) 300 mg Oral Tablet 1 Tablet (300 mg  total)    VRAYLAR 3 mg Oral Capsule Take 1 Capsule (3 mg total) by mouth Once a day     Allergies[1]  Family Medical History:    None         Social History[2]         Physical Examination:  Vitals:    02/21/24 1402   BP: (!) 126/95   Pulse: 91   Temp: 36.7 C (98 F)   SpO2: 96%   Weight: 88.5 kg (195 lb)   Height: 1.778 m (5' 10)   BMI: 27.98      General:  Looks older than stated age. in no acute distress.    Vital signs are present above and have been reviewed by me     HEENT: Atraumatic, Normocephalic.    Lungs: Nonlabored breathing with symmetric expansion    Heart:Regular wth respect to rate.    Abdomen:Soft.  Reducible epigastric hernia that is definitely different than his exam was last year.  Reduces to a palpable underlying fascial defect.  Concurrent umbilical hernia which is reducible as well.    Psychiatric: Alert and oriented to person, place, and time. affect appropriate      Assessment and Plan    ICD-10-CM    1. Ventral hernia without obstruction or gangrene  K43.9       2. Umbilical hernia without obstruction and without gangrene  K42.9              Risks, benefits, indications, complications of laparoscopic robotic ventral hernia repair with umbilical hernia repair at the same setting was discussed in detail including but not limited to bleeding, infection, reaction to anesthesia, nerve entrapment/injury, recurrence of hernia, and non-resolution of pain.   Discussed the etiology of the incisional hernia and the associated higher recurrence rates.  Discussed the use of prosthetic mesh and its potential benefits and complications.  All questions were answered to the patient`s satisfaction with informed consent clearly obtained.     We will await final results of his CT scan as well to make sure that there was no other findings that would alter this managed          I appreciate the opportunity to be involved in the care of your patients.  If you have any questions or concerns regarding this encounter, please do not hesitate to contact me at your convenience.      Alica Inks MD MBA CPE FACS     This note may have been partially generated using MModal Fluency Direct system, and there may be some incorrect words, spellings, and punctuation that were not noted in checking the note before saving, though effort was made to avoid such errors.                 [1]   Allergies  Allergen Reactions    Morphine Rash    Amitriptyline Nausea/ Vomiting     States muscle spasms   [2]   Social History  Tobacco Use    Smoking status: Never    Smokeless tobacco: Never   Vaping Use    Vaping status: Every Day    Substances: Nicotine   Substance Use Topics    Alcohol use: Yes     Comment: 3 times a week    Drug use: Not Currently

## 2024-02-21 NOTE — H&P (Signed)
 GENERAL SURGERY, [facility]  [address]  [place] [address]    History and Physical    Name: Matthew Valenzuela MRN:  [id number]   Date: 02/21/2024 DOB:  1986/08/03 (37 y.o.)                  Reason for Visit: Hernia    History of Present Illness  Mr. [name] who returns repeat evaluation of his abdominal wall.  Patient was previously evaluated by me January 2024 and felt to have a diastasis rectus with the appropriate management discussion at that time.    He states his abdominal wall exam has changed since that time.  Has developed an increasing prominence in the epigastrium that is seems to be present even without changes in position/Valsalva that was the primary cause at the time of his previous examination.    Has had no prior hernia repair.  Has had no previous operative intervention to his abdominal wall.    Is scheduled for a CT scan later this week        MEDICAL DECISION:    Review of prior external note(s) from each unique source:  Patients referral to this office including a recent assessment by the referring provider.  This was reviewed by me for this unique office visit for the indication and intent of the referral as well as any pertinent medical or surgical history relevant to the patients independent evaluation by me today.        Patient Data  Patient History  Past Medical History:   Diagnosis Date    Anemia     Anxiety     Bipolar disorder, unspecified     Chronic pancreatitis     Erectile dysfunction     Esophageal reflux     Fatty liver     Hepatitis C     Hypertension     Seizure (CMS HCC)     Sleep apnea     Tachycardia     Vitamin D deficiency          Past Surgical History:   Procedure Laterality Date    COLONOSCOPY      ESOPHAGOGASTRODUODENOSCOPY           Current Outpatient Medications   Medication Sig    ALPRAZolam (XANAX) 1 mg Oral Tablet Take 1/2 to 1 Tablet DAILY AS NEEDED for severe social anxiety    amLODIPine (NORVASC) 2.5 mg Oral Tablet Take 1  Tablet (2.5 mg total) by mouth Once a day    atorvastatin (LIPITOR) 20 mg Oral Tablet Take 1 Tablet (20 mg total) by mouth Every night    baclofen (LIORESAL) 10 mg Oral Tablet Take 1 Tablet (10 mg total) by mouth Three times a day as needed    famotidine (PEPCID) 20 mg Oral Tablet Take 1 Tablet (20 mg total) by mouth Twice daily (Patient not taking: Reported on 02/21/2024)    gabapentin (NEURONTIN) 300 mg Oral Capsule TAKE 1 CAPSULE by mouth IN THE MORNING AND 1 CAPSULE before bed    levETIRAcetam (KEPPRA) 500 mg Oral Tablet Take 1 Tablet (500 mg total) by mouth    omeprazole (PRILOSEC) 40 mg Oral Capsule, Delayed Release(E.C.) Take 1 Capsule (40 mg total) by mouth Once a day    propranoloL (INDERAL LA) 60 mg Oral Capsule,Sustained Action 24 hr Take 1 Capsule (60 mg total) by mouth Once a day    QUEtiapine (SEROQUEL) 300 mg Oral Tablet 1 Tablet (300 mg  total)    VRAYLAR 3 mg Oral Capsule Take 1 Capsule (3 mg total) by mouth Once a day     Allergies[1]  Family Medical History:    None         Social History[2]         Physical Examination:  Vitals:    02/21/24 1402   BP: (!) 126/95   Pulse: 91   Temp: 36.7 C (98 F)   SpO2: 96%   Weight: 88.5 kg (195 lb)   Height: 1.778 m (5' 10)   BMI: 27.98      General:  Looks older than stated age. in no acute distress.    Vital signs are present above and have been reviewed by me     HEENT: Atraumatic, Normocephalic.    Lungs: Nonlabored breathing with symmetric expansion    Heart:Regular wth respect to rate.    Abdomen:Soft.  Reducible epigastric hernia that is definitely different than his exam was last year.  Reduces to a palpable underlying fascial defect.  Concurrent umbilical hernia which is reducible as well.    Psychiatric: Alert and oriented to person, place, and time. affect appropriate      Assessment and Plan    ICD-10-CM    1. Ventral hernia without obstruction or gangrene  K43.9       2. Umbilical hernia without obstruction and without gangrene  K42.9              Risks, benefits, indications, complications of laparoscopic robotic ventral hernia repair with umbilical hernia repair at the same setting was discussed in detail including but not limited to bleeding, infection, reaction to anesthesia, nerve entrapment/injury, recurrence of hernia, and non-resolution of pain.   Discussed the etiology of the incisional hernia and the associated higher recurrence rates.  Discussed the use of prosthetic mesh and its potential benefits and complications.  All questions were answered to the patient`s satisfaction with informed consent clearly obtained.     We will await final results of his CT scan as well to make sure that there was no other findings that would alter this managed          I appreciate the opportunity to be involved in the care of your patients.  If you have any questions or concerns regarding this encounter, please do not hesitate to contact me at your convenience.      Alica Inks MD MBA CPE FACS     This note may have been partially generated using MModal Fluency Direct system, and there may be some incorrect words, spellings, and punctuation that were not noted in checking the note before saving, though effort was made to avoid such errors.                 [1]   Allergies  Allergen Reactions    Morphine Rash    Amitriptyline Nausea/ Vomiting     States muscle spasms   [2]   Social History  Tobacco Use    Smoking status: Never    Smokeless tobacco: Never   Vaping Use    Vaping status: Every Day    Substances: Nicotine   Substance Use Topics    Alcohol use: Yes     Comment: 3 times a week    Drug use: Not Currently

## 2024-03-06 ENCOUNTER — Encounter (HOSPITAL_COMMUNITY): Payer: Self-pay | Admitting: Surgery

## 2024-03-07 ENCOUNTER — Encounter (HOSPITAL_COMMUNITY): Payer: Self-pay | Admitting: Surgery

## 2024-03-07 ENCOUNTER — Encounter (HOSPITAL_COMMUNITY)
Admission: RE | Disposition: A | Discharge status: Home / Self Care | Payer: Self-pay | Source: Ambulatory Visit | Attending: Surgery

## 2024-03-07 ENCOUNTER — Ambulatory Visit (HOSPITAL_COMMUNITY): Admitting: Anesthesiology

## 2024-03-07 ENCOUNTER — Ambulatory Visit (HOSPITAL_COMMUNITY): Admitting: Surgery

## 2024-03-07 ENCOUNTER — Ambulatory Visit
Admission: RE | Admit: 2024-03-07 | Discharge: 2024-03-07 | Disposition: A | Discharge status: Home / Self Care | Source: Ambulatory Visit | Attending: Surgery | Admitting: Surgery

## 2024-03-07 ENCOUNTER — Other Ambulatory Visit: Payer: Self-pay

## 2024-03-07 DIAGNOSIS — K429 Umbilical hernia without obstruction or gangrene: Secondary | ICD-10-CM | POA: Insufficient documentation

## 2024-03-07 DIAGNOSIS — K439 Ventral hernia without obstruction or gangrene: Secondary | ICD-10-CM | POA: Insufficient documentation

## 2024-03-07 HISTORY — DX: Diarrhea, unspecified: R19.7

## 2024-03-07 HISTORY — DX: Irritable bowel syndrome, unspecified: K58.9

## 2024-03-07 HISTORY — DX: Prediabetes: R73.03

## 2024-03-07 HISTORY — DX: Hyperlipidemia, unspecified: E78.5

## 2024-03-07 HISTORY — DX: Gout, unspecified: M10.9

## 2024-03-07 HISTORY — DX: Palpitations: R00.2

## 2024-03-07 HISTORY — DX: Constipation, unspecified: K59.00

## 2024-03-07 SURGERY — ROBOTIC INCISIONAL HERNIA REPAIR
Anesthesia: General | Site: Abdomen | Wound class: Clean Wound: Uninfected operative wounds in which no inflammation occurred

## 2024-03-07 MED ORDER — ROPIVACAINE (PF) 2 MG/ML (0.2 %) INJECTION SOLUTION
INTRAMUSCULAR | Status: AC
Start: 2024-03-07 — End: 2024-03-07
  Filled 2024-03-07: qty 20

## 2024-03-07 MED ORDER — ROPIVACAINE (PF) 2 MG/ML (0.2 %) INJECTION SOLUTION
Freq: Once | INTRAMUSCULAR | Status: DC | PRN
Start: 2024-03-07 — End: 2024-03-07
  Administered 2024-03-07: 20 mL via INTRAMUSCULAR

## 2024-03-07 MED ORDER — CEFAZOLIN 1 GRAM SOLUTION FOR INJECTION
Freq: Once | INTRAMUSCULAR | Status: DC | PRN
Start: 2024-03-07 — End: 2024-03-07
  Administered 2024-03-07: 2000 mg via INTRAVENOUS

## 2024-03-07 MED ORDER — IPRATROPIUM 0.5 MG-ALBUTEROL 3 MG (2.5 MG BASE)/3 ML NEBULIZATION SOLN
3.0000 mL | INHALATION_SOLUTION | Freq: Once | RESPIRATORY_TRACT | Status: DC | PRN
Start: 2024-03-07 — End: 2024-03-07

## 2024-03-07 MED ORDER — CEFAZOLIN 2 GRAM INTRAVENOUS SOLUTION
INTRAVENOUS | Status: AC
Start: 2024-03-07 — End: 2024-03-07
  Filled 2024-03-07: qty 14.71

## 2024-03-07 MED ORDER — MIDAZOLAM 5 MG/ML INJECTION WRAPPER
INTRAMUSCULAR | Status: AC
Start: 2024-03-07 — End: 2024-03-07
  Filled 2024-03-07: qty 1

## 2024-03-07 MED ORDER — ROCURONIUM 10 MG/ML INTRAVENOUS SOLUTION
Freq: Once | INTRAVENOUS | Status: DC | PRN
Start: 2024-03-07 — End: 2024-03-07
  Administered 2024-03-07: 50 mg via INTRAVENOUS

## 2024-03-07 MED ORDER — SUGAMMADEX 100 MG/ML INTRAVENOUS SOLUTION
Freq: Once | INTRAVENOUS | Status: DC | PRN
Start: 2024-03-07 — End: 2024-03-07
  Administered 2024-03-07: 200 mg via INTRAVENOUS

## 2024-03-07 MED ORDER — SODIUM CHLORIDE 0.9 % (FLUSH) INJECTION SYRINGE
3.0000 mL | INJECTION | Freq: Three times a day (TID) | INTRAMUSCULAR | Status: DC
Start: 2024-03-07 — End: 2024-03-07

## 2024-03-07 MED ORDER — SODIUM CHLORIDE 0.9 % (FLUSH) INJECTION SYRINGE
3.0000 mL | INJECTION | INTRAMUSCULAR | Status: DC | PRN
Start: 2024-03-07 — End: 2024-03-07

## 2024-03-07 MED ORDER — SIMETHICONE 80 MG CHEWABLE TABLET
80.0000 mg | CHEWABLE_TABLET | Freq: Four times a day (QID) | ORAL | Status: DC | PRN
Start: 2024-03-07 — End: 2024-03-07
  Administered 2024-03-07: 80 mg via ORAL
  Filled 2024-03-07: qty 1

## 2024-03-07 MED ORDER — SODIUM CHLORIDE 0.9 % INTRAVENOUS SOLUTION
2.0000 g | Freq: Once | INTRAVENOUS | Status: DC
Start: 2024-03-07 — End: 2024-03-07

## 2024-03-07 MED ORDER — PROCHLORPERAZINE EDISYLATE 10 MG/2 ML (5 MG/ML) INJECTION SOLUTION
5.0000 mg | Freq: Once | INTRAMUSCULAR | Status: DC | PRN
Start: 2024-03-07 — End: 2024-03-07

## 2024-03-07 MED ORDER — ETHYL ALCOHOL 62 % TOPICAL SWAB
1.0000 | Freq: Once | CUTANEOUS | Status: AC
Start: 2024-03-07 — End: 2024-03-07
  Administered 2024-03-07: 1 via NASAL

## 2024-03-07 MED ORDER — ONDANSETRON HCL (PF) 4 MG/2 ML INJECTION SOLUTION
4.0000 mg | Freq: Four times a day (QID) | INTRAMUSCULAR | Status: DC | PRN
Start: 2024-03-07 — End: 2024-03-07

## 2024-03-07 MED ORDER — PROPOFOL 10 MG/ML IV BOLUS
INJECTION | Freq: Once | INTRAVENOUS | Status: DC | PRN
Start: 2024-03-07 — End: 2024-03-07
  Administered 2024-03-07: 150 mg via INTRAVENOUS

## 2024-03-07 MED ORDER — ONDANSETRON HCL (PF) 4 MG/2 ML INJECTION SOLUTION
Freq: Once | INTRAMUSCULAR | Status: DC | PRN
Start: 2024-03-07 — End: 2024-03-07
  Administered 2024-03-07: 4 mg via INTRAVENOUS

## 2024-03-07 MED ORDER — HYDROCODONE 5 MG-ACETAMINOPHEN 325 MG TABLET
1.0000 | ORAL_TABLET | ORAL | Status: DC | PRN
Start: 2024-03-07 — End: 2024-03-07
  Administered 2024-03-07: 1 via ORAL
  Filled 2024-03-07: qty 1

## 2024-03-07 MED ORDER — FENTANYL (PF) 50 MCG/ML INJECTION SOLUTION
INTRAMUSCULAR | Status: AC
Start: 2024-03-07 — End: 2024-03-07
  Filled 2024-03-07: qty 2

## 2024-03-07 MED ORDER — FENTANYL (PF) 50 MCG/ML INJECTION WRAPPER
25.0000 ug | INJECTION | INTRAMUSCULAR | Status: DC | PRN
Start: 2024-03-07 — End: 2024-03-07

## 2024-03-07 MED ORDER — SODIUM CHLORIDE 0.9 % INTRAVENOUS SOLUTION
INTRAVENOUS | Status: AC
Start: 2024-03-07 — End: 2024-03-07
  Filled 2024-03-07: qty 50

## 2024-03-07 MED ORDER — ONDANSETRON HCL (PF) 4 MG/2 ML INJECTION SOLUTION
4.0000 mg | Freq: Once | INTRAMUSCULAR | Status: DC | PRN
Start: 2024-03-07 — End: 2024-03-07

## 2024-03-07 MED ORDER — HYDROMORPHONE 2 MG/ML INJECTION WRAPPER
INJECTION | Freq: Once | INTRAMUSCULAR | Status: DC | PRN
Start: 2024-03-07 — End: 2024-03-07
  Administered 2024-03-07: .5 mg via INTRAVENOUS
  Administered 2024-03-07: 1 mg via INTRAVENOUS

## 2024-03-07 MED ORDER — LIDOCAINE (PF) 100 MG/5 ML (2 %) INTRAVENOUS SYRINGE
INJECTION | Freq: Once | INTRAVENOUS | Status: DC | PRN
Start: 2024-03-07 — End: 2024-03-07
  Administered 2024-03-07: 60 mg via INTRAVENOUS

## 2024-03-07 MED ORDER — ALBUTEROL SULFATE 2.5 MG/3 ML (0.083 %) SOLUTION FOR NEBULIZATION
2.5000 mg | INHALATION_SOLUTION | Freq: Once | RESPIRATORY_TRACT | Status: DC | PRN
Start: 2024-03-07 — End: 2024-03-07

## 2024-03-07 MED ORDER — LACTATED RINGERS INTRAVENOUS SOLUTION
INTRAVENOUS | Status: DC
Start: 2024-03-07 — End: 2024-03-07

## 2024-03-07 MED ORDER — FENTANYL (PF) 50 MCG/ML INJECTION WRAPPER
50.0000 ug | INJECTION | INTRAMUSCULAR | Status: DC | PRN
Start: 2024-03-07 — End: 2024-03-07

## 2024-03-07 MED ORDER — DEXAMETHASONE SODIUM PHOSPHATE 4 MG/ML INJECTION SOLUTION
Freq: Once | INTRAMUSCULAR | Status: DC | PRN
Start: 2024-03-07 — End: 2024-03-07
  Administered 2024-03-07: 4 mg via INTRAVENOUS

## 2024-03-07 MED ORDER — HYDROMORPHONE 2 MG/ML INJECTION WRAPPER
INJECTION | INTRAMUSCULAR | Status: AC
Start: 2024-03-07 — End: 2024-03-07
  Filled 2024-03-07: qty 1

## 2024-03-07 MED ORDER — HYDROCODONE 5 MG-ACETAMINOPHEN 325 MG TABLET
1.0000 | ORAL_TABLET | Freq: Three times a day (TID) | ORAL | 0 refills | Status: AC | PRN
Start: 2024-03-07 — End: 2024-03-14

## 2024-03-07 MED ORDER — MIDAZOLAM 5 MG/ML INJECTION WRAPPER
Freq: Once | INTRAMUSCULAR | Status: DC | PRN
Start: 2024-03-07 — End: 2024-03-07
  Administered 2024-03-07 (×2): 2.5 mg via INTRAVENOUS

## 2024-03-07 MED ORDER — LACTATED RINGERS INTRAVENOUS SOLUTION
INTRAVENOUS | Status: DC | PRN
Start: 2024-03-07 — End: 2024-03-07
  Administered 2024-03-07: 0 via INTRAVENOUS

## 2024-03-07 MED ORDER — FENTANYL (PF) 50 MCG/ML INJECTION WRAPPER
INJECTION | Freq: Once | INTRAMUSCULAR | Status: DC | PRN
Start: 2024-03-07 — End: 2024-03-07
  Administered 2024-03-07 (×2): 50 ug via INTRAVENOUS

## 2024-03-07 MED ORDER — KETAMINE 100 MG/ML INJECTION SOLUTION
INTRAMUSCULAR | Status: AC
Start: 2024-03-07 — End: 2024-03-07
  Filled 2024-03-07: qty 5

## 2024-03-07 MED ORDER — KETAMINE 100 MG/ML INJECTION SOLUTION
Freq: Once | INTRAMUSCULAR | Status: DC | PRN
Start: 2024-03-07 — End: 2024-03-07
  Administered 2024-03-07: 50 mg via INTRAVENOUS

## 2024-03-07 SURGICAL SUPPLY — 48 items
ADH LIQUID LF  WTPRF VIAL PREP NONSTAIN MASTISOL STYRAX GUM MASTIC ALC MTHY SLCYT STRL CLR NHZR 2/3 (WOUND CARE SUPPLY) ×1 IMPLANT
BLADE 15 2 END CBNSTL SURG STRL DISP (SURGICAL CUTTING SUPPLIES) ×1 IMPLANT
CLEANER INSTRUMENT PRE-KLENZ 13.5 OZ (MISCELLANEOUS PT CARE ITEMS) ×1 IMPLANT
CONTAINR CLICKSEAL 4OZ TRANSLUC SCREW CAP STRL BLU SPECI PNEUM TUBE SYS (SPECIMEN COLLECTION SUPPLIES) ×1 IMPLANT
COUNTER 20 CNT BLOCK ADH NEEDLE STRL LF  RD SHARP FOAM 15.75X11.5X14IN DISP (MED SURG SUPPLIES) ×1 IMPLANT
COVER 53X24IN MAYOSTAND PRXM STRL DISP EQP SMS LF (DRAPE/PACKS/SHEETS/OR TOWEL) ×1 IMPLANT
COVER EQP 90X60IN HVDTY BCK PAD FNFLD BLU STRL (DRAPE/PACKS/SHEETS/OR TOWEL) ×1 IMPLANT
DRAPE ARM 21X19X10.5IN DAVINCI XI EQP 21LB (DRAPE/PACKS/SHEETS/OR TOWEL) ×3 IMPLANT
DRAPE CLMN DAVINCI XI EQP (DRAPE/PACKS/SHEETS/OR TOWEL) ×1 IMPLANT
DRAPE SURG KC100 LAP CHOLE REINF ARMBRD CVR HKLP TUBE HLDR FEN 104X76X120IN STD BSC STRL (DRAPE/PACKS/SHEETS/OR TOWEL) ×1 IMPLANT
ELECTRODE HANDPIECE 5MMX37CM LIGASURE XP ELCTRSRG ENDO (SURGICAL CUTTING SUPPLIES) ×1 IMPLANT
ELECTRODE PATIENT RTN 9FT VLAB C30- LB RM PHSV ACRL FOAM CORD NONIRRITATE NONSENSITIZE ADH STRP (SURGICAL CUTTING SUPPLIES) ×1 IMPLANT
GLOVE SURG 6.5 LTX PF SMOOTH BEAD CUF STRL YW 11.5IN PROTEXIS NEU-THERA DDRGL THK8.7 MIL (GLOVES AND ACCESSORIES) IMPLANT
GLOVE SURG 7 LF  PF BEAD CUF STRL CRM 11.8IN PROTEXIS PI PLISPRN THK9.1 MIL (GLOVES AND ACCESSORIES) ×2 IMPLANT
GLOVE SURG 7 LTX PF SMOOTH BEAD CUF STRL YW 12IN PROTEXIS NEU-THERA DDRGL THK8.7 MIL (GLOVES AND ACCESSORIES) ×1 IMPLANT
GLOVE SURG 7.5 LF  PF SMOOTH BEAD CUF INTLK STRL BLU 11.8IN PROTEXIS NEU-THERA PLISPRN THK7.9 MIL (GLOVES AND ACCESSORIES) ×1 IMPLANT
GLOVE SURG 7.5 LTX PF SMOOTH BEAD CUF STRL YW 12IN PROTEXIS (GLOVES AND ACCESSORIES) IMPLANT
GOWN SURG 2XL XLNG LGTH L3 HKLP CLSR RGLN SLEEVE TWL STRL LF  DISP GRN AERO BLU PRFRM FBRC (DRAPE/PACKS/SHEETS/OR TOWEL) ×1 IMPLANT
GOWN SURG LRG STD LGTH L3 HKLP CLSR RGLN SLEEVE TWL STRL LF  DISP GRN AERO BLU PRFRM FBRC (DRAPE/PACKS/SHEETS/OR TOWEL) ×1 IMPLANT
GOWN SURG XL STD LGTH L3 HKLP CLSR RGLN SLEEVE TWL STRL LF  DISP GRN AERO BLU PRFRM FBRC (DRAPE/PACKS/SHEETS/OR TOWEL) ×5 IMPLANT
IMG CLEARIFY 8X6IN WARM HUB TROCAR WIPE MRFBR SYSTEM DISP (ENDOSCOPIC SUPPLIES) ×1 IMPLANT
LABEL MED CORRECT MED LABELING SYS 4 FLG 2 SHEET 24 PRPRNT STRL (MED SURG SUPPLIES) ×1 IMPLANT
NEEDLE HYPO  18GA 1.5IN REG WL BD PRCSNGL POLYPROP REG BVL LL HUB CLR CD DEHP-FR STRL LF  DISP (MED SURG SUPPLIES) ×1 IMPLANT
NEEDLE HYPO  23GA 1.5IN TW PRCSNGL SS POLYPROP REG BVL LL HUB DEHP-FR TRQS STRL LF  DISP (MED SURG SUPPLIES) ×2 IMPLANT
PATCH SURG MONOF SLF EXPD STRAP PCKT VNTRLX SEPRA SORBAFLEX POLYPROP EPTFE SM CRC 1.7IN STRL UMB (IMPLANTS SURGICAL MESH) ×1 IMPLANT
PATCH SURG STRAP MONOF SLF EXPD PCKT VNTRLX POLYPROP EPTFE MED CRC 2.5IN STRL UMB HERNIA REPR (IMPLANTS SURGICAL MESH) ×1 IMPLANT
PEN SURG MRKNG DISP RLR LBL STRL LF  6IN (MED SURG SUPPLIES) ×1 IMPLANT
PENCIL SMOKEVAC COAT PSHBTN LF (MED SURG SUPPLIES) ×1 IMPLANT
PROTECTOR BACK SURGYFOAM NONSLIP TBL OP NONSTRL DISP (MED SURG SUPPLIES) ×1 IMPLANT
SCISSOR LAPSCP 2 EDGE BLADE ROT DIAL ERG HNDL LOW PROF EPIX D DR SS 35CM LF  DISP 5MM (ENDOSCOPIC SUPPLIES) IMPLANT
SEAL CANNULA 5-12 MM UNIVERSAL DISPOSABLE (ROBOTIC SUPPLIES) ×3 IMPLANT
SET TUBING PNEUMOCLEAR HIFLO SMOKE EVAC (MED SURG SUPPLIES) ×1 IMPLANT
SLEEVE COMPRESS MED KNEE LGTH KENDALL SCD SEQ NONST LF  DISP 21- IN DVT PE (MED SURG SUPPLIES) ×1 IMPLANT
SOL IRRG 0.9% NACL 1000ML PLASTIC PR BTL ISTNC N-PYRG STRL LF (MEDICATIONS/SOLUTIONS) ×1 IMPLANT
SPONGE LAP 18X18IN PREWASH RIGID TRY STRL LF  WHT (MED SURG SUPPLIES) ×1 IMPLANT
SPONGE SURG 4X4IN 16 PLY XRY DETECT COTTON STRL LF  DISP (WOUND CARE SUPPLY) ×1 IMPLANT
STAPLER SKIN 4.1X6.5MM 35 W STPL CART LF  APS U DISP CLR SS PLASTIC (WOUND CARE SUPPLY) IMPLANT
STRIP 4X.5IN STRSTRP PLSTR REINF SKNCLS WHT STRL LF (WOUND CARE SUPPLY) ×1 IMPLANT
SUTURE 0 CT1 VICRYL+ 36IN VIOL BRD ANBCTRL COAT ABS (SUTURE/WOUND CLOSURE) ×2 IMPLANT
SUTURE 0 CT2 VICRYL+ 27IN VIOL BRD ANBCTRL COAT ABS (SUTURE/WOUND CLOSURE) ×1 IMPLANT
SUTURE 2-0 CT2 VICRYL 27IN VIOL BRD COAT ABS (SUTURE/WOUND CLOSURE) ×1 IMPLANT
SUTURE 2-0 SH SPRL STRATAFIX MONOCRYL + 20CM UNDYED ABS KNOTLESS TISS CONTROL ANBCTRL ABS (SUTURE/WOUND CLOSURE) ×2 IMPLANT
SUTURE 2-0 SH VICRYL+ 27IN VIOL BRD ANBCTRL COAT ABS (SUTURE/WOUND CLOSURE) IMPLANT
SUTURE 4-0 PS2 MONOCRYL MTPS 27IN UNDYED MONOF ABS (SUTURE/WOUND CLOSURE) ×1 IMPLANT
SYRINGE LL 10ML LF  STRL GRAD N-PYRG DEHP-FR PVC FREE MED DISP (MED SURG SUPPLIES) ×3 IMPLANT
TIP CAUT HOT SHR DAVINCI ENDOWRIST 8MM STD CAUT MONOPOL DISP (ROBOTIC SUPPLIES) ×1 IMPLANT
TOWEL 24X16IN COTTON BLU DISP SURG STRL LF (DRAPE/PACKS/SHEETS/OR TOWEL) ×3 IMPLANT
TROCAR LAPSCP 100MM 12MM KII BAL BLUNT TIP STRL LF  OPTC ACCESS SYS ABDOMINAL (ENDOSCOPIC SUPPLIES) ×1 IMPLANT

## 2024-03-07 NOTE — Nurses Notes (Signed)
 Patient transported to [facility]. Patient's vitals: 96.44F temp, 97% oxygen saturation on 2 liters nasal cannula, 103 HR, 16 RR, 133/83 BP. Patient's incision sites and IV site dry and intact. Scant amount of bloody drainage noted to steri strips at umbilicus. Patient's care endorsed at bedside to Gloria Kanode, RN.

## 2024-03-07 NOTE — Anesthesia Transfer of Care (Signed)
 ANESTHESIA TRANSFER OF CARE   Matthew Valenzuela is a 38 y.o. ,male, Weight: 88.5 kg (195 lb)   had Procedure(s):  LAPAROSCOPIC ROBOTIC ASSISTED EPIGASTRIC HERNIA REPAIR WITH MESH AND UMBILICAL HERNIA REPAIR WITH MESH  performed  03/07/24   Primary Service: Alm Nam, MD    Past Medical History:   Diagnosis Date   . Anemia    . Anxiety    . Bipolar disorder, unspecified    . Chronic pancreatitis    . Constipation    . Diarrhea    . Erectile dysfunction    . Esophageal reflux    . Fatty liver    . Gout    . Hepatitis C    . Hyperlipidemia    . Hypertension    . Irritable bowel syndrome    . Palpitations    . Prediabetes    . Seizure (CMS HCC)    . Sleep apnea    . Tachycardia    . Vitamin D deficiency       Allergy History as of 03/07/24       AMITRIPTYLINE         Noted Status Severity Type Reaction    03/06/24 0942 Silva Stabs, RN 05/04/16 Active Low  Nausea/ Vomiting, Myalgia    Comments: States muscle spasms     08/19/22 1551 [place], LPN 91/71/82 Active Low  Nausea/ Vomiting    Comments: States muscle spasms               MORPHINE         Noted Status Severity Type Reaction    03/06/24 0942 Silva Stabs, RN 05/04/16 Active Low  Itching    03/06/24 0942 Silva Stabs, RN 05/04/16 Active Medium  Rash, Itching    03/06/24 0941 Silva Stabs, RN 05/04/16 Active High  Rash, Itching    08/19/22 1551 [place], Lucie, LPN 91/71/82 Active Medium  Rash                  I completed my transfer of care / handoff to the receiving personnel during which we discussed:  Access, Airway, All key/critical aspects of case discussed, Analgesia, Antibiotics, Expectation of post procedure, Fluids/Product, Gave opportunity for questions and acknowledgement of understanding, Labs and PMHx      Post Location: PACU                                                           Last OR Temp: Temperature: 36.5 C (97.7 F)  ABG:  POTASSIUM   Date Value Ref Range Status   02/21/2024 3.7 3.5 - 5.1 mmol/L Final      CALCIUM   Date Value Ref Range Status   02/21/2024 8.7 8.6 - 10.3 mg/dL Final     Airway:* No LDAs found *  Blood pressure 117/85, pulse 90, temperature 36.5 C (97.7 F), resp. rate 12, height 1.778 m (5' 10), weight 88.5 kg (195 lb), SpO2 98%.

## 2024-03-07 NOTE — Discharge Instructions (Addendum)
 Follow up with Dr. Steen on 03/20/24 @11am      Do not remove steri strips.  They will come off on their own.    Leave dressing to umbilicus on for 5 days.  After 5 days remove the dressing and you can begin getting that area wet.  Apply antibiotic ointment to the site at the umbilicus daily for 5 days.    Unlimited ambulation.    No heavy lifting or straining greater than 20 pounds until advised by the Dr.      Over the counter Gas X for gas discomfort    Call for problems, questions, concerns.    Be sure to pick up your Rx. At your pharmacy     RX for home sent in to your pharmacy.    Resume home meds.

## 2024-03-07 NOTE — Anesthesia Preprocedure Evaluation (Signed)
 ANESTHESIA PRE-OP EVALUATION  Planned Procedure: LAPAROSCOPIC ROBOTIC ASSISTED EPIGASTRIC HERNIA REPAIR; UMBILICAL HERNIA REPAIR (Abdomen)  Review of Systems     anesthesia history negative     patient summary reviewed  nursing notes reviewed        Pulmonary   current smoker and Denies smoking in last 24  hours,  no sleep apnea   Cardiovascular    Hypertension, well controlled, ECG reviewed and hyperlipidemia ,       GI/Hepatic/Renal    GERD, well controlled, liver disease and + hepatitis C        Endo/Other    anemia,   type 2 diabetes/ poorly controlled    Neuro/Psych/MS    seizures, well controlled, anxiety     Cancer    negative hematology/oncology ROS,                     Physical Assessment      Airway       Mallampati: II    TM distance: >3 FB    Neck ROM: full  Mouth Opening: good.  Facial hair  Beard  No endotracheal tube present  No Tracheostomy present    Dental           (+) chipped, poor dentition           Pulmonary    Breath sounds clear to auscultation  (-) no rhonchi, no decreased breath sounds, no wheezes, no rales and no stridor     Cardiovascular      Rate: Abnormal       Other findings              Plan  ASA 2     Planned anesthesia type: general     general anesthesia with endotracheal tube intubation      PONV Plan:  I plan to administer pharmcologic prophalaxis antiemetics  POV PLAN:   plan for postoperative opioid use    SLEEP APNEA  Patient is at risk of obstructive sleep apnea        Intravenous induction     Anesthesia issues/risks discussed are: Dental Injuries, PONV, Post-op Pain Management, Stroke, Aspiration, Intraoperative Awareness/ Recall, Cardiac Events/MI, Blood Loss and Sore Throat.  Anesthetic plan and risks discussed with patient  signed consent obtained      Use of blood products discussed with patient who.      Patient's NPO status is appropriate for Anesthesia.           Plan discussed with CRNA.

## 2024-03-07 NOTE — Anesthesia Postprocedure Evaluation (Signed)
 Anesthesia Post Op Evaluation    Patient: Matthew Valenzuela  Procedure(s):  LAPAROSCOPIC ROBOTIC ASSISTED EPIGASTRIC HERNIA REPAIR WITH MESH AND UMBILICAL HERNIA REPAIR WITH MESH    Last Vitals:Temperature: 36.5 C (97.7 F) (03/07/24 1454)  Heart Rate: 96 (03/07/24 1510)  BP (Non-Invasive): 110/87 (03/07/24 1510)  Respiratory Rate: 12 (03/07/24 1510)  SpO2: 97 % (03/07/24 1510)    No notable events documented.      Patient location during evaluation: PACU       Patient participation: complete - patient participated  Level of consciousness: awake    Pain management: adequate    Anesthetic complications: no  Cardiovascular status: acceptable  Respiratory status: acceptable  Hydration status: acceptable  Patient post-procedure temperature: Pt Normothermic   PONV Status: Absent

## 2024-03-07 NOTE — Interval H&P Note (Signed)
 [facility]      H&P UPDATE FORM                                                                                  Austin, Herd, 38 y.o. male  Date of Admission:  03/07/2024  Date of Birth:  1985/10/18    03/07/2024    STOP: IF H&P IS GREATER THAN 30 DAYS FROM SURGICAL DAY COMPLETE NEW H&P IS REQUIRED.     H & P updated the day of the procedure.  1.  H&P completed within 30 days of surgical procedure and has been reviewed within 24 hours of admission but prior to surgery or a procedure requiring anesthesia services, the patient has been examined, and no change has occured in the patients condition since the H&P was completed.       Change in medications: No              Comments:     2.  Patient continues to be appropriate candidate for planned surgical procedure. YES    Alm Nam, MD

## 2024-03-07 NOTE — Interval H&P Note (Signed)
 [facility]      H&P UPDATE FORM                                                                                  Robson, Trickey, 38 y.o. male  Date of Admission:  03/07/2024  Date of Birth:  Oct 26, 1985    03/07/2024    STOP: IF H&P IS GREATER THAN 30 DAYS FROM SURGICAL DAY COMPLETE NEW H&P IS REQUIRED.     H & P updated the day of the procedure.  1.  H&P completed within 30 days of surgical procedure and has been reviewed within 24 hours of admission but prior to surgery or a procedure requiring anesthesia services, the patient has been examined, and no change has occured in the patients condition since the H&P was completed.       Change in medications: No              Comments:     2.  Patient continues to be appropriate candidate for planned surgical procedure. YES    Alm Nam, MD

## 2024-03-07 NOTE — OR Surgeon (Signed)
 [facility]      Patient Name: Matthew Valenzuela, Matthew Valenzuela Number: [id number]  Date of Service: 03/07/2024   Date of Birth: 02-Feb-1986      Pre-Operative Diagnosis: Epigastric hernia [K43.9]  Umbilical hernia without obstruction and without gangrene [K42.9]     Post-Operative Diagnosis: Epigastric hernia [K43.9]  Umbilical hernia without obstruction and without gangrene [K42.9]    Procedure(s)/Description:  LAPAROSCOPIC ROBOTIC ASSISTED EPIGASTRIC HERNIA REPAIR WITH with medium Ventralex patch closure    UMBILICAL HERNIA REPAIR WITH small Ventralex patch closure      Attending Surgeon: Alm Nam, MD     Anesthesia:  Anesthesiologist: Lenetta Redell ORN, MD  CRNA: Gasper Jacks, CRNA; Terrea Lew, CRNA    Anesthesia Type: .General     Estimated Blood Loss:  Minimal    Specimen umbilical hernia sac    Patient was taken to the operating room.  General endotracheal anesthesia was introduced without difficulty.  Abdomen was prepped and draped in the usual sterile fashion.  Skin incision was made over the defect at the level of the umbilicus.  Electrocautery was then carried down to the underlying defect and the hernia sac was circumferentially opened at the level of the fascial defect.  Hernia sac was sent for specimen evaluation.  Remaining omental contents were displaced into the abdominal cavity.  The anterior abdominal wall was clear of any residual adhesions.  The size of the facial defect was 2.2 cm.  This was used for the access site for the laparoscopic portion of the procedure.  Pneumoperitoneum established through this defect.  Two 8 mm robotic trocar sites were created on the right and left lateral aspects of the abdomen.  The defect in the epigastrium was identified.  It was slightly to the left of the midline.  The peritoneum was scored and the hernia sac delivered from the defect in total.  The size of the defect was 2.5 cm.  Defect was closed primarily using a 0 Stratafix suture.  Subsequently a  medium Ventralex patch was obtained.  It was inserted into the abdominal cavity.  It was affixed to the anterior abdominal wall.  It was circumferentially sutured to the surrounding abdominal wall using a 0 Stratafix suture.  Pneumoperitoneum was then reduced.  Small Ventrelex patch was then obtained.  It was placed through the umbilical defect into the abdominal cavity with the Marlex portion in opposition to the intra-abdominal wall.  The defect was then closed primarily using #1 Vicryl sutures.  The tails of the Ventrelex were amputated at the level of the fascial defect.  The cicatrix of the umbilicus was reapproximated to the underlying fascia using a 3-0 Vicryl suture.  The subcutaneous tissue closed using 3-0 Vicryl suture.  The area was anesthetized using a field block of local anesthesia.  The skin was closed using 5-0 Biosyn.  Tolerated procedure without difficulty.  Transported to PACU in stable condition.  Alm DELENA Nam MD MBA CPE FACS

## 2024-03-08 ENCOUNTER — Telehealth (INDEPENDENT_AMBULATORY_CARE_PROVIDER_SITE_OTHER): Payer: Self-pay | Admitting: Surgery

## 2024-03-09 DIAGNOSIS — K429 Umbilical hernia without obstruction or gangrene: Secondary | ICD-10-CM

## 2024-03-09 LAB — SURGICAL PATHOLOGY SPECIMEN

## 2024-03-15 ENCOUNTER — Encounter (INDEPENDENT_AMBULATORY_CARE_PROVIDER_SITE_OTHER): Payer: Self-pay | Admitting: Surgery

## 2024-03-15 ENCOUNTER — Ambulatory Visit (INDEPENDENT_AMBULATORY_CARE_PROVIDER_SITE_OTHER): Admitting: Surgery

## 2024-03-15 ENCOUNTER — Other Ambulatory Visit: Payer: Self-pay

## 2024-03-15 VITALS — BP 139/94 | HR 102 | Temp 97.0°F | Wt 188.0 lb

## 2024-03-15 DIAGNOSIS — K439 Ventral hernia without obstruction or gangrene: Secondary | ICD-10-CM

## 2024-03-15 DIAGNOSIS — R9389 Abnormal findings on diagnostic imaging of other specified body structures: Secondary | ICD-10-CM

## 2024-03-15 DIAGNOSIS — Z48815 Encounter for surgical aftercare following surgery on the digestive system: Secondary | ICD-10-CM

## 2024-03-15 DIAGNOSIS — K429 Umbilical hernia without obstruction or gangrene: Secondary | ICD-10-CM

## 2024-03-15 MED ORDER — HYDROCODONE 7.5 MG-ACETAMINOPHEN 325 MG TABLET
1.0000 | ORAL_TABLET | Freq: Three times a day (TID) | ORAL | 0 refills | Status: AC | PRN
Start: 2024-03-15 — End: 2024-03-22

## 2024-03-15 NOTE — Progress Notes (Signed)
 GENERAL SURGERY, [facility]  [address]  [place] [address]    Progress Note    Name: Matthew Valenzuela MRN:  [id number]   Date: 03/15/2024 DOB:  [birth year] (38 y.o.)              Date of Birth:  Feb 10, 1986  PCP: Lucie Buerger, FNP  Referring:  No ref. provider found     HPI:  Matthew Valenzuela is a 38 y.o. White male who returns following epigastric and umbilical hernia repair performed by me March 07, 2024 .  He has had moderate amount of narcotic requirements since the procedure.  States that the pain medicine has not been able to control his symptoms.  Otherwise he has no new complaints.        Past Medical History:   Diagnosis Date    Anemia     Anxiety     Bipolar disorder, unspecified     Chronic pancreatitis     Constipation     Diarrhea     Erectile dysfunction     Esophageal reflux     Fatty liver     Gout     Hepatitis C     Hyperlipidemia     Hypertension     Irritable bowel syndrome     Palpitations     Prediabetes     Seizure (CMS [facility])     Sleep apnea     Tachycardia     Vitamin D deficiency       Past Surgical History:   Procedure Laterality Date    COLONOSCOPY      ESOPHAGOGASTRODUODENOSCOPY        No outpatient medications have been marked as taking for the 03/15/24 encounter (Appointment) with Steen Lenis, MD.      Allergies[1]        There were no vitals taken for this visit.         General: appropriate for age. in no acute distress.    Vital signs are present above and have been reviewed by me     Abdomen:  Completely appropriate appearance for his laparoscopic hernia repair.  There is no evidence of any recurrent or residual epigastric or umbilical hernia at this time.      Assessment/Plan:  Assessment/Plan   1. Umbilical hernia without obstruction and without gangrene    2. Ventral hernia without obstruction or gangrene         Completely expected postoperative appearance of his epigastrium/umbilical hernia at this time.  Tried to reassure him about the  postprandial narcotic requirements.  We will give him a very short additional amount of narcotics but would not plan for any further beyond this.    Discussed his pancreatic MRI report.  Does have a cystic structure in the head of the pancreas.  Probably related to chronic pancreatitis.  Needs an EUS.  Offered referral for him today.  He wants to call back about this at a later time.      This note was partially created using voice recognition software and is inherently subject to errors including those of syntax and sound alike  substitutions which may escape proof reading. In such instances, original meaning may be extrapolated by contextual derivation.    Lenis DELENA Steen MD MBA CPE FACS         [1]   Allergies  Allergen Reactions    Amitriptyline Nausea/ Vomiting and Myalgia     States muscle spasms  Morphine Itching

## 2024-03-20 ENCOUNTER — Encounter (INDEPENDENT_AMBULATORY_CARE_PROVIDER_SITE_OTHER): Payer: Self-pay | Admitting: Surgery

## 2024-03-20 ENCOUNTER — Telehealth (INDEPENDENT_AMBULATORY_CARE_PROVIDER_SITE_OTHER): Payer: Self-pay | Admitting: Surgery

## 2024-03-20 NOTE — Telephone Encounter (Signed)
 Phone number listed is actually mothers number. Patient is not available. Informed her pt needs to keep his scheduled visit to be evaled. Robie Boers, LPN  2/85/7974 85:78

## 2024-03-20 NOTE — Telephone Encounter (Signed)
 Patient called stating since 7/10 he has had green drainage from the umbilical site with redness. Sp umb hernia repair on 03/07/24. Please advise. Robie Boers, LPN  2/85/7974 88:89

## 2024-03-26 NOTE — Progress Notes (Deleted)
 GENERAL SURGERY, [facility]  [address]  [place] [address]       Name: Matthew Valenzuela MRN:  [id number]   Date: 03/27/2024 Age: 38 y.o. [birth year]      PCP: Lucie Buerger, FNP     Subjective  Matthew Valenzuela is a 38 y.o. year old male who presents for follow up of laparoscopic robotic epigastric hernia repair as well as umbilical hernia on March 07, 2024.      Patient pleased with their postoperative progress at this point.  Has had moderate degree of incisional discomfort.  Has noted no recurrent bulge or incisional problems.  No GI or urinary difficulties postoperatively.    Current Outpatient Medications   Medication Sig    ALPRAZolam (XANAX) 1 mg Oral Tablet Take 0.5 Tablets (0.5 mg total) by mouth Once per day as needed for Anxiety    amLODIPine (NORVASC) 2.5 mg Oral Tablet Take 1 Tablet (2.5 mg total) by mouth Daily    atorvastatin (LIPITOR) 20 mg Oral Tablet Take 1 Tablet (20 mg total) by mouth Every night    baclofen (LIORESAL) 10 mg Oral Tablet Take 1 Tablet (10 mg total) by mouth Three times a day as needed    famotidine (PEPCID) 20 mg Oral Tablet Take 1 Tablet (20 mg total) by mouth Twice daily    gabapentin (NEURONTIN) 300 mg Oral Capsule Take 1 Capsule (300 mg total) by mouth Twice daily    levETIRAcetam (KEPPRA) 500 mg Oral Tablet Take 1 Tablet (500 mg total) by mouth Twice daily    omeprazole (PRILOSEC) 40 mg Oral Capsule, Delayed Release(E.C.) Take 1 Capsule (40 mg total) by mouth Daily    propranoloL (INDERAL LA) 60 mg Oral Capsule,Sustained Action 24 hr Take 1 Capsule (60 mg total) by mouth Daily    QUEtiapine (SEROQUEL) 300 mg Oral Tablet 1 Tablet (300 mg total) Every night    VRAYLAR 3 mg Oral Capsule Take 1 Capsule (3 mg total) by mouth Daily          Objective: There were no vitals taken for this visit.           General:appropriate for age. in no acute distress.    Vital signs are present above and have been reviewed by me     Hernia:  Patient was examined in  the standing and lying position.  There was no evidence of recurrent or residual hernia.  Operative incision sites healing without difficulty.  No erythema or expressible drainage.    Assessment/Plan  Assessment/Plan   1. Umbilical hernia without obstruction and without gangrene    2. Epigastric hernia        Patient is pleased with the results.  There is no evidence of recurrent or residual hernia on today's exam.  Preoperative symptoms have resolved.  I would be happy to see again at any time.  Opportunity was given for questions, and none were voiced past the above discussion.  The patient is free to return at any time for further questions and or difficulties.    Alm DELENA Nam MD MBA CPE FACS

## 2024-03-27 ENCOUNTER — Encounter (INDEPENDENT_AMBULATORY_CARE_PROVIDER_SITE_OTHER): Admitting: Surgery

## 2024-03-27 DIAGNOSIS — K439 Ventral hernia without obstruction or gangrene: Secondary | ICD-10-CM

## 2024-03-27 DIAGNOSIS — K429 Umbilical hernia without obstruction or gangrene: Secondary | ICD-10-CM

## 2024-03-29 ENCOUNTER — Encounter (INDEPENDENT_AMBULATORY_CARE_PROVIDER_SITE_OTHER): Payer: Self-pay | Admitting: Surgery

## 2024-03-29 NOTE — Nursing Note (Signed)
 No-show letter mailed.  [place], [place]  03/29/2024 09:59

## 2024-06-14 ENCOUNTER — Other Ambulatory Visit: Payer: Self-pay

## 2024-06-15 ENCOUNTER — Ambulatory Visit (INDEPENDENT_AMBULATORY_CARE_PROVIDER_SITE_OTHER): Payer: Self-pay

## 2024-09-11 ENCOUNTER — Encounter (INDEPENDENT_AMBULATORY_CARE_PROVIDER_SITE_OTHER): Payer: Self-pay

## 2024-09-11 ENCOUNTER — Ambulatory Visit (INDEPENDENT_AMBULATORY_CARE_PROVIDER_SITE_OTHER)

## 2024-09-11 ENCOUNTER — Other Ambulatory Visit: Payer: Self-pay

## 2024-09-11 VITALS — BP 97/73 | HR 123 | Ht 70.0 in | Wt 190.0 lb

## 2024-09-11 DIAGNOSIS — E1122 Type 2 diabetes mellitus with diabetic chronic kidney disease: Secondary | ICD-10-CM

## 2024-09-11 DIAGNOSIS — N182 Chronic kidney disease, stage 2 (mild): Secondary | ICD-10-CM

## 2024-09-11 DIAGNOSIS — I129 Hypertensive chronic kidney disease with stage 1 through stage 4 chronic kidney disease, or unspecified chronic kidney disease: Secondary | ICD-10-CM

## 2024-09-11 DIAGNOSIS — Z7984 Long term (current) use of oral hypoglycemic drugs: Secondary | ICD-10-CM

## 2024-09-11 DIAGNOSIS — E872 Acidosis, unspecified: Secondary | ICD-10-CM

## 2024-09-11 DIAGNOSIS — E559 Vitamin D deficiency, unspecified: Secondary | ICD-10-CM

## 2024-09-11 DIAGNOSIS — I10 Essential (primary) hypertension: Secondary | ICD-10-CM

## 2024-09-11 DIAGNOSIS — Z87448 Personal history of other diseases of urinary system: Secondary | ICD-10-CM

## 2024-09-11 MED ORDER — SODIUM BICARBONATE 650 MG TABLET: 650.0000 mg | ORAL_TABLET | Freq: Three times a day (TID) | ORAL | 1 refills | Status: AC

## 2024-09-11 NOTE — Progress Notes (Signed)
 NEPHROLOGY, [facility]  [address]  [place] [address]    Progress Note    Name: Matthew Valenzuela MRN:  [id number]   Date: 09/11/2024 DOB:  1986-05-13 (38 y.o.)              Nephrology Out  Patient Visit       Reason for the visit/consultation: CKD  HPI : 39 y.o. with CKD 2 with proteinuria (bcr: 1.1-1.3), HTN, DM, Seizure, Bipolar, alcohol  use disorder, GERD presenting to establish care for CKD.    Seen today with mother in the office  Previously followed with nephrologist in 2023  Describes having episodes of recurrent dehydration in the setting of alcohol  use disorder  Reports making urine  Denies hematuria  Denies family history of CKD or ESRD    HTN  Known hypertensive for the past 3 years  Currently on amlodipine 10 mg daily  Reports adherence to low dietary sodium intake    DM  Currently on metformin 1 g b.i.d.    Lab work from other lab:  Most recent labs from [facility], dated 08/23/2024 reviewed with patient [ creatinine 1.12, EGFR of 86]    ROS:     Systematic review of 12 organ systems was negative except what mentioned in the HPI.     OBJECTIVE:   BP 97/73   Pulse (!) 123   Ht 1.778 m (5' 10)   Wt 86.2 kg (190 lb)   BMI 27.26 kg/m         General:  Patient is awake and in no distress.   HEENT: Normocephalic and atraumatic  NECK: No increased JVD.   HEART: Normal S1 and S2.Regular Rhythm.   LUNGS: Clear to auscultation bilaterally.    ABDOMEN: Soft, nontender and nondistended.    EXTREMITIES: No edema.   SKIN: Skin is warm and dry.  NEURO : Patient is alert and oriented to person, place, and time.       LABORATORY DATA:   Lab Results   Component Value Date    BUN 15 02/21/2024    CREATININE 0.97 02/21/2024    BUNCRRATIO 15 02/21/2024    GFR 103 02/21/2024    SODIUM 140 02/21/2024    POTASSIUM 3.7 02/21/2024    CHLORIDE 105 02/21/2024    CO2 24 02/21/2024    ANIONGAP 11 02/21/2024    CALCIUM 8.7 02/21/2024    HGB 14.5 02/21/2024    HCT 42.6 02/21/2024            MEDICATIONS:  Outpatient Medications Marked as Taking for the 09/11/24 encounter (Office Visit) with Marnell Dauphin, MD   Medication Sig    amLODIPine (NORVASC) 2.5 mg Oral Tablet Take 1 Tablet (2.5 mg total) by mouth Daily    atorvastatin (LIPITOR) 20 mg Oral Tablet Take 1 Tablet (20 mg total) by mouth Every night    baclofen  (LIORESAL ) 10 mg Oral Tablet Take 1 Tablet (10 mg total) by mouth Three times a day as needed    gabapentin (NEURONTIN) 300 mg Oral Capsule Take 1 Capsule (300 mg total) by mouth Twice daily    levETIRAcetam  (KEPPRA ) 500 mg Oral Tablet Take 1 Tablet (500 mg total) by mouth Twice daily    metFORMIN (GLUCOPHAGE XR) 500 mg Oral Tablet Sustained Release 24 hr Take 2 Tablets (1,000 mg total) by mouth Daily    omeprazole (PRILOSEC) 40 mg Oral Capsule, Delayed Release(E.C.) Take 1 Capsule (40 mg total) by mouth Daily    propranoloL (INDERAL  LA) 60 mg Oral Capsule,Sustained Action 24 hr Take 1 Capsule (60 mg total) by mouth Daily    QUEtiapine (SEROQUEL) 300 mg Oral Tablet 1 Tablet (300 mg total) Every night    SUBOXONE 8-2 mg Sublingual Film Place 2 Film under the tongue Daily    VRAYLAR 3 mg Oral Capsule Take 1 Capsule (3 mg total) by mouth Daily    zolpidem (AMBIEN) 10 mg Oral Tablet Take 1 Tablet (10 mg total) by mouth Every night     Current Outpatient Medications   Medication Instructions    ALPRAZolam (XANAX) 1 mg Oral Tablet Take 0.5 Tablets (0.5 mg total) by mouth Once per day as needed for Anxiety    amLODIPine (NORVASC) 2.5 mg, Daily    atorvastatin (LIPITOR) 20 mg, NIGHTLY    baclofen  (LIORESAL ) 10 mg, 3 TIMES DAILY PRN    gabapentin (NEURONTIN) 300 mg Oral Capsule Take 1 Capsule (300 mg total) by mouth Twice daily    levETIRAcetam  (KEPPRA ) 500 mg, 2 TIMES DAILY    metFORMIN (GLUCOPHAGE XR) 1,000 mg, Daily    omeprazole (PRILOSEC) 40 mg, Daily    propranoloL (INDERAL LA) 60 mg, Daily    QUEtiapine (SEROQUEL) 300 mg, NIGHTLY    SUBOXONE 8-2 mg Sublingual Film 2 Film, Daily    Vraylar  3 mg, Daily    zolpidem (AMBIEN) 10 mg, NIGHTLY       ASSESSMENT / PLAN:   ENCOUNTER DIAGNOSES     ICD-10-CM   1. CKD (chronic kidney disease) stage 2, GFR 60-89 ml/min  N18.2   2. Essential hypertension  I10   3. Type 2 diabetes mellitus with stage 2 chronic kidney disease, without long-term current use of insulin (CMS HCC)  (CMS HCC)  E11.22    N18.2   4. Vitamin D deficiency  E94.9     39 y.o. with CKD 2 with proteinuria (bcr: 1.1-1.3), HTN, DM, Seizure, Bipolar, alcohol  use disorder, GERD presenting to establish care for CKD.     #Chronic kidney disease stage G2/A2  -CKD likely due to diabetes and hypertension with multiple AKIs  -Baseline creatinine 1.1-1.3  -Lab work from other lab:  Most recent labs from [facility], dated 08/23/2024 reviewed with [ creatinine 1.12, EGFR of 86]  Lab Results   Component Value Date    CREATININE 0.97 02/21/2024       -Total protein to creatinine ratio-   -UACR of 85mg /g from 04/19/24  -Renal imaging: None    plan  -Will check UA, UACR, UPCR, CBC and BMP with next visit  -check RBUS to assess for structural renal changes in CKD  -Continue low-sodium diet  -balanced Fluid intake 40-50 oz a day.    -Avoid nephrotoxins like contrast and NSAIDs. Recommend Tylenol  only for pain  -Will assess for SGLT2 use  -Return to clinic in 3 months      #CKD - mineral bone disease     -Not on binders  -check calcium and phosphorus  -check iPTH and 25-OH-Vit D      #Acidosis  -bicarb goal of 22  -recent bicarb of 19  -Bicarb tabs 650mg  TID      #Hyperkalemia  -K of 4.7  -No indication for binders/lokelma at this time  -counseled on low K diet      #Hypertension  -Blood pressure is acceptable  -Home medications:   -Goal less than 130/80  -Continue above medications  -Counseled on Low-salt diet      #Diabetes type 2  -Current  A1c: 6.0 from [facility]  -A1c goal is less than 7    -Home medications: Metformin 1g BID  -Continue current medications  -Diabetic diet  -Increase activity and exercise as  possible  -Monitor A1C      #Gout Risk  -Check uric acid     -Counseled on low uric acid diet  -continue current management      #Anemia of CKD  -HB at goal  Lab Results   Component Value Date    HGB 14.5 02/21/2024       -Will follow for need for Epo if HB less than 9.0          CKD counseling  I have discussed with the patient the following issues:  The main goal of treatment is to prevent progression of CKD to complete kidney failure. The best way to do this is to control the underlying cause.  the optimal diet is one similar to the Dietary Approaches to Stop Hypertension (DASH) diet, consisting of fruits, vegetables, legumes, fish, poultry, and whole grains.  Restrict sodium intake to less than 2 g/day          Orders Placed This Encounter    US  KIDNEY    BASIC METABOLIC PANEL    CBC/DIFF    MAGNESIUM    MICROALBUMIN/CREATININE RATIO, URINE, RANDOM    PARATHYROID HORMONE (PTH)    PROTEIN/CREATININE RATIO, URINE, RANDOM    URIC ACID    VITAMIN D 25 TOTAL    PHOSPHORUS          Suzann Gustin, MD

## 2024-09-13 ENCOUNTER — Other Ambulatory Visit: Payer: Self-pay

## 2024-09-13 ENCOUNTER — Ambulatory Visit (INDEPENDENT_AMBULATORY_CARE_PROVIDER_SITE_OTHER): Payer: Self-pay | Admitting: NEUROLOGY

## 2024-09-13 ENCOUNTER — Ambulatory Visit: Attending: NEUROLOGY

## 2024-09-13 DIAGNOSIS — G250 Essential tremor: Secondary | ICD-10-CM

## 2024-09-13 DIAGNOSIS — D354 Benign neoplasm of pineal gland: Secondary | ICD-10-CM

## 2024-09-13 DIAGNOSIS — S069X6D Unspecified intracranial injury with loss of consciousness greater than 24 hours without return to pre-existing conscious level with patient surviving, subsequent encounter: Secondary | ICD-10-CM

## 2024-09-13 DIAGNOSIS — G40909 Epilepsy, unspecified, not intractable, without status epilepticus: Secondary | ICD-10-CM | POA: Insufficient documentation

## 2024-09-13 DIAGNOSIS — R202 Paresthesia of skin: Secondary | ICD-10-CM | POA: Insufficient documentation

## 2024-09-13 DIAGNOSIS — M79602 Pain in left arm: Secondary | ICD-10-CM | POA: Insufficient documentation

## 2024-09-13 DIAGNOSIS — M79601 Pain in right arm: Secondary | ICD-10-CM | POA: Insufficient documentation

## 2024-09-13 DIAGNOSIS — S069XAA Unspecified intracranial injury with loss of consciousness status unknown, initial encounter: Secondary | ICD-10-CM | POA: Insufficient documentation

## 2024-09-13 MED ORDER — NAYZILAM 5 MG/SPRAY (0.1 ML) NASAL SPRAY: 5.0000 mg | Freq: Once | NASAL | 2 refills | Status: AC | PRN

## 2024-09-13 MED ORDER — LEVETIRACETAM ER 1,000 MG TABLET,EXTENDED RELEASE 24 HR: 2000.0000 mg | ORAL_TABLET | Freq: Every day | ORAL | 1 refills | Status: DC

## 2024-09-13 MED ORDER — BACLOFEN 20 MG TABLET: 20.0000 mg | ORAL_TABLET | Freq: Three times a day (TID) | ORAL | 2 refills | Status: AC
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
## 2024-09-14 ENCOUNTER — Other Ambulatory Visit (INDEPENDENT_AMBULATORY_CARE_PROVIDER_SITE_OTHER): Payer: Self-pay | Admitting: NEUROLOGY

## 2024-09-14 ENCOUNTER — Encounter (INDEPENDENT_AMBULATORY_CARE_PROVIDER_SITE_OTHER): Payer: Self-pay | Admitting: NEUROLOGY

## 2024-09-14 MED ORDER — LEVETIRACETAM ER 500 MG TABLET,EXTENDED RELEASE 24 HR: 2000.0000 mg | ORAL_TABLET | Freq: Every day | ORAL | 0 refills | Status: AC

## 2024-09-14 NOTE — Nursing Note (Signed)
 PATIENT IS SCHEDULED FOR MRI OF THE BRAIN ON 10/13/2024 @ 6:30 PM- LEFT VM ON PTS PHONE ABOUT THE TIME AND DATE

## 2024-09-18 LAB — LEVETIRACETAM, SERUM: LEVETIRACETAM: 21.8 ug/mL

## 2024-09-21 ENCOUNTER — Ambulatory Visit (HOSPITAL_COMMUNITY): Payer: Self-pay

## 2024-09-27 ENCOUNTER — Ambulatory Visit
Admission: RE | Admit: 2024-09-27 | Discharge: 2024-09-27 | Disposition: A | Discharge status: Home / Self Care | Source: Ambulatory Visit | Attending: NEUROLOGY | Admitting: NEUROLOGY

## 2024-09-27 ENCOUNTER — Other Ambulatory Visit: Payer: Self-pay

## 2024-09-27 DIAGNOSIS — M79602 Pain in left arm: Secondary | ICD-10-CM | POA: Insufficient documentation

## 2024-09-27 DIAGNOSIS — G40909 Epilepsy, unspecified, not intractable, without status epilepticus: Secondary | ICD-10-CM | POA: Insufficient documentation

## 2024-09-27 DIAGNOSIS — R202 Paresthesia of skin: Secondary | ICD-10-CM | POA: Insufficient documentation

## 2024-09-27 DIAGNOSIS — S069X6D Unspecified intracranial injury with loss of consciousness greater than 24 hours without return to pre-existing conscious level with patient surviving, subsequent encounter: Secondary | ICD-10-CM | POA: Insufficient documentation

## 2024-09-27 DIAGNOSIS — M79601 Pain in right arm: Secondary | ICD-10-CM | POA: Insufficient documentation

## 2024-09-27 NOTE — Procedures (Signed)
 ELECTROENCEPHALOGRAM REPORT    Test Date:  September 27, 2024    Interpretation Date:  September 27, 2024    Location:  [facility] neurodiagnostic lab    Patient Referred By:  Garnette Hard, MD    EEG Number:  [id number]      INDICATIONS FOR PROCEDURE:   He is a 39 year old man who is referred for electrodiagnostic testing for hallucinations and delusions in the setting of history of epilepsy.    Medications:   Current Outpatient Medications   Medication Instructions    ALPRAZolam (XANAX) 1 mg Oral Tablet Take 0.5 Tablets (0.5 mg total) by mouth Once per day as needed for Anxiety    amLODIPine (NORVASC) 2.5 mg, Daily    atorvastatin (LIPITOR) 20 mg, NIGHTLY    Baclofen  (LIORESAL ) 20 mg, Oral, 3 TIMES DAILY    gabapentin (NEURONTIN) 300 mg Oral Capsule Take 1 Capsule (300 mg total) by mouth Twice daily    Levetiracetam  (KEPPRA  XR) 2,000 mg, Oral, Daily    metFORMIN (GLUCOPHAGE XR) 1,000 mg, Daily    Nayzilam  5 mg, INTRANASAL, ONCE PRN    omeprazole (PRILOSEC) 40 mg, Daily    propranolol (INDERAL LA) 60 mg, Daily    sodium bicarbonate  650 mg, Oral, 3 TIMES DAILY    SUBOXONE 8-2 mg Sublingual Film 2 Film, Daily    Vraylar 3 mg, Daily    zolpidem (AMBIEN) 10 mg, NIGHTLY        DESCRIPTION OF PROCEDURE: Electrodes were applied using paste technique in positions dictated by the International 10-20 system of placement. Recording montages included both referential and bipolar derivations. In addition to EEG data, EKG was recorded.    This is a routine EEG recorded on September 27, 2024, running from 07:54:14 until 08:24:29 for 30 minutes and 15 seconds of data.    DESCRIPTION OF ACTIVITIES: At the onset of the recording, the patient is awake lying supine on the stretcher with his eyes open. The posterior dominant rhythm reaches 9 Hz, with amplitudes of 30 to 60 microvolts. There are lower amplitude fast frequencies in the anterior head regions, indicating maintenance of an anterior to posterior gradient.    We  do see evidence of drowsiness and light sleep, with the appearance of vertex waves.     Photic stimulation does not produce a driving response or epileptiform activation. Hyperventilation is not performed.    No epileptiform discharges or seizures are seen.    A single channel EKG shows normal sinus rhythm.    CLINICAL INTERPRETATION: This routine length EEG with video is normal, encompassing the awake and sleep states. Note that a single normal EEG does not exclude the diagnosis of epilepsy, and clinical correlation is advised.    CANDIE Darlyn Free, DO  Assistant Professor of Neurology  [place]  [facility]

## 2024-09-28 ENCOUNTER — Ambulatory Visit (INDEPENDENT_AMBULATORY_CARE_PROVIDER_SITE_OTHER): Payer: Self-pay

## 2024-09-28 NOTE — Result Encounter Note (Signed)
 Spoke with the patients mother and let her know EEG was normal. She stated understanding

## 2024-10-06 ENCOUNTER — Other Ambulatory Visit (INDEPENDENT_AMBULATORY_CARE_PROVIDER_SITE_OTHER)

## 2024-10-13 ENCOUNTER — Ambulatory Visit

## 2024-10-27 ENCOUNTER — Encounter (INDEPENDENT_AMBULATORY_CARE_PROVIDER_SITE_OTHER): Payer: Self-pay | Admitting: NEUROLOGY

## 2024-11-06 ENCOUNTER — Other Ambulatory Visit (INDEPENDENT_AMBULATORY_CARE_PROVIDER_SITE_OTHER)

## 2024-12-18 ENCOUNTER — Encounter (INDEPENDENT_AMBULATORY_CARE_PROVIDER_SITE_OTHER): Payer: Self-pay | Admitting: NEUROLOGY

## 2024-12-18 ENCOUNTER — Encounter (INDEPENDENT_AMBULATORY_CARE_PROVIDER_SITE_OTHER): Payer: Self-pay
